# Patient Record
Sex: Female | Born: 1935 | Race: White | Hispanic: No | Smoking: Former smoker
Health system: Southern US, Community
[De-identification: ages and names within clinical notes are randomized; demographics above are authoritative.]

## PROBLEM LIST (undated history)

## (undated) DIAGNOSIS — N189 Chronic kidney disease, unspecified: Secondary | ICD-10-CM

## (undated) DIAGNOSIS — I10 Essential (primary) hypertension: Secondary | ICD-10-CM

## (undated) HISTORY — PX: OTHER SURGICAL HISTORY: SHX169

---

## 1998-04-08 ENCOUNTER — Other Ambulatory Visit: Admission: RE | Admit: 1998-04-08 | Discharge: 1998-04-08 | Payer: Self-pay | Admitting: Family Medicine

## 1998-05-27 ENCOUNTER — Ambulatory Visit (HOSPITAL_COMMUNITY): Admission: RE | Admit: 1998-05-27 | Discharge: 1998-05-27 | Payer: Self-pay | Admitting: Gastroenterology

## 1999-10-03 ENCOUNTER — Encounter: Admission: RE | Admit: 1999-10-03 | Discharge: 1999-10-03 | Payer: Self-pay | Admitting: Family Medicine

## 1999-10-03 ENCOUNTER — Encounter: Payer: Self-pay | Admitting: Family Medicine

## 2000-11-11 ENCOUNTER — Emergency Department (HOSPITAL_COMMUNITY): Admission: EM | Admit: 2000-11-11 | Discharge: 2000-11-11 | Payer: Self-pay | Admitting: Emergency Medicine

## 2000-11-11 ENCOUNTER — Encounter: Payer: Self-pay | Admitting: Emergency Medicine

## 2001-04-05 ENCOUNTER — Ambulatory Visit (HOSPITAL_COMMUNITY): Admission: RE | Admit: 2001-04-05 | Discharge: 2001-04-05 | Payer: Self-pay | Admitting: Gastroenterology

## 2001-09-17 ENCOUNTER — Inpatient Hospital Stay (HOSPITAL_COMMUNITY): Admission: EM | Admit: 2001-09-17 | Discharge: 2001-09-20 | Payer: Self-pay

## 2005-03-13 ENCOUNTER — Encounter: Admission: RE | Admit: 2005-03-13 | Discharge: 2005-03-13 | Payer: Self-pay | Admitting: Nephrology

## 2006-02-26 ENCOUNTER — Ambulatory Visit: Payer: Self-pay | Admitting: Oncology

## 2006-03-05 LAB — CBC WITH DIFFERENTIAL/PLATELET
Basophils Absolute: 0 10*3/uL (ref 0.0–0.1)
EOS%: 3.4 % (ref 0.0–7.0)
Eosinophils Absolute: 0.2 10*3/uL (ref 0.0–0.5)
HGB: 12.8 g/dL (ref 11.6–15.9)
LYMPH%: 36.8 % (ref 14.0–48.0)
MCH: 34.1 pg — ABNORMAL HIGH (ref 26.0–34.0)
MCV: 99.4 fL (ref 81.0–101.0)
MONO%: 11.3 % (ref 0.0–13.0)
NEUT#: 2.3 10*3/uL (ref 1.5–6.5)
NEUT%: 47.6 % (ref 39.6–76.8)
Platelets: 179 10*3/uL (ref 145–400)
RDW: 13.2 % (ref 11.3–14.5)

## 2006-03-08 LAB — URIC ACID: Uric Acid, Serum: 9.6 mg/dL — ABNORMAL HIGH (ref 2.4–7.0)

## 2006-03-08 LAB — IMMUNOFIXATION ELECTROPHORESIS
IgA: 169 mg/dL (ref 68–378)
IgG (Immunoglobin G), Serum: 1020 mg/dL (ref 694–1618)
IgM, Serum: 200 mg/dL (ref 60–263)
Total Protein, Serum Electrophoresis: 6.4 g/dL (ref 6.0–8.3)

## 2006-03-08 LAB — COMPREHENSIVE METABOLIC PANEL
AST: 16 U/L (ref 0–37)
Alkaline Phosphatase: 86 U/L (ref 39–117)
BUN: 38 mg/dL — ABNORMAL HIGH (ref 6–23)
Creatinine, Ser: 3.2 mg/dL — ABNORMAL HIGH (ref 0.4–1.2)
Glucose, Bld: 87 mg/dL (ref 70–99)
Potassium: 4.7 mEq/L (ref 3.5–5.3)
Total Bilirubin: 1 mg/dL (ref 0.3–1.2)

## 2006-03-08 LAB — KAPPA/LAMBDA LIGHT CHAINS: Kappa:Lambda Ratio: 1.39 (ref 0.26–1.65)

## 2006-03-21 LAB — UIFE/LIGHT CHAINS/TP QN, 24-HR UR
Free Kappa Lt Chains,Ur: 4.91 mg/dL — ABNORMAL HIGH (ref 0.04–1.51)
Free Kappa/Lambda Ratio: 12.59 ratio — ABNORMAL HIGH (ref 0.46–4.00)
Free Lambda Excretion/Day: 6.14 mg/d
Free Lt Chn Excr Rate: 77.33 mg/d
Time: 24 hours
Total Protein, Urine-Ur/day: 98 mg/d (ref 10–140)
Total Protein, Urine: 6.2 mg/dL

## 2006-03-21 LAB — CREATININE CLEARANCE, URINE, 24 HOUR
Creatinine, 24H Ur: 1216 mg/d (ref 700–1800)
Creatinine: 3 mg/dL — ABNORMAL HIGH (ref 0.4–1.2)
Urine Total Volume-CRCL: 1575 mL

## 2006-04-02 ENCOUNTER — Ambulatory Visit: Admission: RE | Admit: 2006-04-02 | Discharge: 2006-04-02 | Payer: Self-pay | Admitting: Vascular Surgery

## 2006-07-13 ENCOUNTER — Ambulatory Visit: Payer: Self-pay | Admitting: Oncology

## 2006-07-19 LAB — UIFE/LIGHT CHAINS/TP QN, 24-HR UR
Albumin, U: DETECTED
Alpha 1, Urine: DETECTED — AB
Alpha 2, Urine: DETECTED — AB
Beta, Urine: DETECTED — AB
Free Kappa Lt Chains,Ur: 5.89 mg/dL — ABNORMAL HIGH (ref 0.04–1.51)
Free Lambda Excretion/Day: 7.59 mg/d
Free Lt Chn Excr Rate: 101.6 mg/d
Gamma Globulin, Urine: DETECTED — AB
Total Protein, Urine-Ur/day: 121 mg/d (ref 10–140)
Total Protein, Urine: 7 mg/dL
Volume, Urine: 1725 mL

## 2006-07-21 LAB — IMMUNOFIXATION ELECTROPHORESIS: IgA: 149 mg/dL (ref 68–378)

## 2006-07-21 LAB — KAPPA/LAMBDA LIGHT CHAINS
Kappa free light chain: 4.83 mg/dL — ABNORMAL HIGH (ref 0.33–1.94)
Lambda Free Lght Chn: 3.15 mg/dL — ABNORMAL HIGH (ref 0.57–2.63)

## 2007-04-26 ENCOUNTER — Ambulatory Visit: Payer: Self-pay | Admitting: Vascular Surgery

## 2010-11-20 ENCOUNTER — Encounter: Payer: Self-pay | Admitting: Nephrology

## 2011-03-14 NOTE — Assessment & Plan Note (Signed)
OFFICE VISIT   Mcintyre, Lisa N  DOB:  Mar 23, 1927                                       04/26/2007  QMVHQ#:[id number]   The patient is sent for followup from Dr. Kathrene Bongo.  She currently  is not on hemodialysis.  She had a left brachiocephalic AV fistula  placed in June of 2007.   EXAMINATION:  Today blood pressure is 140/78, heart rate is 80 and  regular.  She has an easily palpable thrill in the left antecubital  region.  The fistula is approximately 6-mm in diameter over its first  four centimeters.  However, it gets smaller in the upper arm.  She has  some occasional mild symptoms of steal in her left hand which she states  usually resolved with moving her hand.  This is manifested as some  numbness and coolness occasionally.  She has no ischemic ulcerations on  the hand.   Since she is currently not on dialysis I would defer placement of any  new access at this time.  However, if she needed dialysis at this time,  the fistula is probably not going to be usable because the proximal  segment is not a long enough segment to stick for dialysis.  The upper  arm segment is rather small.  I discussed this with the patient today.  If she requires dialysis eminently, at some point in the future we need  to convert this to an AV graft.  However we will defer this for now  until she will need definitive dialysis treatment.  She will follow up  on an as-needed basis.   Janetta Hora. Fields, MD  Electronically Signed   CEF/MEDQ  D:  04/26/2007  T:  04/29/2007  Job:  [id number]

## 2011-03-17 NOTE — H&P (Signed)
[place]  Patient:    Lisa Mcintyre, Lisa Mcintyre Visit Number: [id number] MRN: [id number]          Service Type: MED Location: RCRM 2550 12 Attending Physician:  Wende Mott Dictated by:   Kennieth Rad, M.D. Admit Date:  09/17/2001                           History and Physical  CHIEF COMPLAINT:  Painful deformed left ankle.  HISTORY OF PRESENT ILLNESS:  This is a 77 year old female who was out walking her dog and slipped and missed her footing, landing onto her buttocks and turning her left ankle.  Patient denies any loss of consciousness. Complaining of severe pain, swelling, and some bleeding from the left ankle.  PAST MEDICAL HISTORY:  High blood pressure, hypercholesterolemia.  PAST SURGICAL HISTORY:  Right carpal tunnel release.  SOCIAL HISTORY:  Habits:  None.  MEDICATIONS:  Atenolol, Lipitor, aspirin.  PHYSICAL EXAMINATION  VITAL SIGNS:  Temperature 98.6, pulse 60, respirations 18, blood pressure 180/94.  GENERAL:  Alert and oriented.  No acute distress.  HEENT:  Normocephalic.  Eyes:  Sclerae and conjunctivae are clear.  NECK:  Supple.  CHEST:  Clear.  CARDIAC:  S1, S2 regular.  EXTREMITIES:  Left ankle:  Open wound, swollen.  Dorsalis pedis is intact. Nail beds pink and blanched.  LABORATORIES:  X-ray revealed bimalleolar fracture of the left ankle.  IMPRESSION:  Open bimalleolar fracture of the left ankle. Dictated by:   Kennieth Rad, M.D. Attending Physician:  Wende Mott DD:  09/17/01 TD:  09/17/01 Job: [id number]

## 2011-03-17 NOTE — Op Note (Signed)
NAMEGENEE, RANN                 ACCOUNT NO.:  [id number]   MEDICAL RECORD NO.:  [id number]          PATIENT TYPE:  AMB   LOCATION:  DFTL                         FACILITY:  [facility]   PHYSICIAN:  Janetta Hora. Fields, MD  DATE OF BIRTH:  17-Jan-1927   DATE OF PROCEDURE:  04/02/2006  DATE OF DISCHARGE:                                 OPERATIVE REPORT   PROCEDURE:  Left brachiocephalic AV fistula.   PREOPERATIVE DIAGNOSIS:  Renal failure.   POSTOPERATIVE DIAGNOSIS:  Renal failure.   ANESTHESIA:  Local with IV sedation.   ASSISTANT:  Nurse.   FINDINGS:  3 mm cephalic vein.   OPERATIVE DETAIL:  After obtaining informed consent, the patient was taken  to the operating room.  The patient placed supine position operating table.  After adequate sedation, the patient's entire left upper extremity prepped  and draped usual sterile fashion.  Local anesthesia was infiltrated over the  left antecubital crease.  Transverse incision was made this location carried  down through subcutaneous tissues down level cephalic vein.  Cephalic vein  was dissected free circumferentially.  Small side branches ligated divided  with silk ties.  Brachial artery was then dissected free in the medial  portion of incision.  This controlled proximal and distal with vessel loops.  The patient was then given 5000 units of intravenous heparin.  The cephalic  vein was ligated distally with 3-0 silk ties.  Vein was then transected and  then flushed thoroughly with heparinized saline.  Vein was marked for  orientation.  Longitudinal arteriotomy was then made in the brachial artery  and the vein swung over the level brachial artery and sewn end of vein to  side of artery using a running 7-0 Prolene suture.  Just prior to completion  anastomosis this was fore bled, back bled and thoroughly flushed.  Anastomosis was secured, clamps released.  There was palpable thrill in the  fistula immediately.  Hemostasis was obtained.   Subcutaneous tissues  reapproximated using running 3-0 Vicryl suture.  Skin was closed with 4-0  Vicryl subcuticular stitch.  The patient tolerated procedure well and no  complications.  Needle, sponge, and instrument counts correct at the end of  the case.  The patient taken to recovery room in stable condition.      Janetta Hora. Fields, MD  Electronically Signed     CEF/MEDQ  D:  04/02/2006  T:  04/02/2006  Job:  [id number]

## 2011-03-17 NOTE — Op Note (Signed)
[place]  Patient:    Lisa Mcintyre, Lisa Mcintyre Visit Number: [id number] MRN: [id number]          Service Type: MED Location: 5000 5024 01 Attending Physician:  Wende Mott Dictated by:   Kennieth Rad, M.D. Proc. Date: 09/17/01 Admit Date:  09/17/2001 Discharge Date: 09/20/2001                             Operative Report  PREOPERATIVE DIAGNOSIS:  Open bimalleolar fracture, left ankle.  POSTOPERATIVE DIAGNOSIS:  Open bimalleolar fracture, left ankle.  PROCEDURES: 1. Irrigation and debridement, left ankle. 2. Open reduction and internal fixation, bimalleolar fracture, with plate    with five screws laterally and medial malleolus screw medially. 3. Application of a short-leg cast.  ANESTHESIA:  General.  DESCRIPTION OF PROCEDURE:  The patient was taken to the operating room after given adequate preop medication and given general anesthesia and intubated. The left lower leg was prepped with Betadine and scrubbed and painted with Betadine solution and draped in a sterile manner.  C-arm was used to visualize the fracture reduction.  The open wound over the medial malleolus transversely was extended both proximally and distally.  Copious irrigation with Simpulse irrigation and antibiotic solution.  Excision and wound debridement about the skin edges was done.  The fracture site was opened and copiously irrigated and curetted to remove soft tissues.  After adequate wound debridement and irrigation, attention was then turned to the lateral malleolus.  Lateral incision made over the lateral malleolus and fibula, going through the skin and subcutaneous tissue down to the fracture site.  A compression plate was applied with five screws, holding it in anatomic and stable position.  Next attention was turned back to the medial malleolus.  The medial malleolus fragment was reduced, stabilized with a K-wire, followed by placement of cannulated  screw fixation across the fracture site, holding it in stable and anatomic position.  Further copious irrigation was done with antibiotic solution.  Wound closed with 2-0 Vicryl for the subcutaneous, skin staples for the skin.  Compressive dressings applied.  A short-leg cast was applied.  The patient tolerated the procedure quite well and went to the recovery room in stable and satisfactory condition. Dictated by:   Kennieth Rad, M.D. Attending Physician:  Wende Mott DD:  10/08/01 TD:  10/09/01 Job: [id number]

## 2011-03-17 NOTE — Discharge Summary (Signed)
[place]. [facility]  Patient:    Lisa Mcintyre, Lisa Mcintyre Visit Number: [id number] MRN: [id number]          Service Type: MED Location: 5000 5024 01 Attending Physician:  Wende Mott Dictated by:   Kennieth Rad, M.D. Admit Date:  09/17/2001 Discharge Date: 09/20/2001                             Discharge Summary  ADMITTING DIAGNOSIS:  Open bimalleolar fracture, left ankle.  DISCHARGE DIAGNOSIS:  Open bimalleolar fracture, left ankle.  COMPLICATIONS:  None.  INFECTIONS:  None.  OPERATIONS:  Irrigation and debridement, open reduction and internal fixation of left bimalleolar fracture, and application of short-leg cast.  HISTORY OF PRESENT ILLNESS:  This is a 77 year old female who was out walking her dog and slipped and fell, turning her left ankle.  The patient had to drag herself back to her home.  The patient denies any loss of consciousness.  PHYSICAL EXAMINATION:  Pertinent physical is that of the left ankle with open wound, swollen.  Dorsalis pedis is intact.  Nail beds pink and blanch. Obvious gross deformity of the left ankle.  HOSPITAL COURSE:  The patient underwent preop laboratories and was found to be stable enough for the patient to undergo surgery.  The patient underwent irrigation and debridement, ORIF of the left ankle, application of a short leg cast.  Postop course:  The patient received preoperative and postoperative IV antibiotics, ice packs, elevation, splitting of the cast, physical therapy, nonweightbearing on the left side.  The patient tolerated therapy quite well and became stable enough to be discharged to return to the office in 10 days on Percocet one p.o. q.4h. p.r.n. for pain, Vioxx 12.5 mg daily, nonweightbearing on the left side, use of walker, and patient discharged in stable and satisfactory condition. Dictated by:   Kennieth Rad, M.D. Attending Physician:  Wende Mott DD:  10/08/01 TD:   10/09/01 Job: [id number]

## 2011-12-13 ENCOUNTER — Other Ambulatory Visit: Payer: Self-pay | Admitting: Family Medicine

## 2011-12-13 ENCOUNTER — Ambulatory Visit
Admission: RE | Admit: 2011-12-13 | Discharge: 2011-12-13 | Disposition: A | Discharge status: Home / Self Care | Payer: Medicare Other | Source: Ambulatory Visit | Attending: Family Medicine | Admitting: Family Medicine

## 2011-12-13 DIAGNOSIS — W19XXXA Unspecified fall, initial encounter: Secondary | ICD-10-CM

## 2013-08-30 ENCOUNTER — Inpatient Hospital Stay (HOSPITAL_COMMUNITY)
Admission: EM | Admit: 2013-08-30 | Discharge: 2013-08-31 | DRG: 689 | Disposition: A | Discharge status: Home Health | Payer: Medicare Other | Attending: Internal Medicine | Admitting: Internal Medicine

## 2013-08-30 ENCOUNTER — Emergency Department (HOSPITAL_COMMUNITY): Payer: Medicare Other

## 2013-08-30 ENCOUNTER — Encounter (HOSPITAL_COMMUNITY): Payer: Self-pay | Admitting: Emergency Medicine

## 2013-08-30 DIAGNOSIS — N390 Urinary tract infection, site not specified: Principal | ICD-10-CM | POA: Diagnosis present

## 2013-08-30 DIAGNOSIS — B86 Scabies: Secondary | ICD-10-CM | POA: Diagnosis present

## 2013-08-30 DIAGNOSIS — E7800 Pure hypercholesterolemia, unspecified: Secondary | ICD-10-CM | POA: Diagnosis present

## 2013-08-30 DIAGNOSIS — I120 Hypertensive chronic kidney disease with stage 5 chronic kidney disease or end stage renal disease: Secondary | ICD-10-CM | POA: Diagnosis present

## 2013-08-30 DIAGNOSIS — G9340 Encephalopathy, unspecified: Secondary | ICD-10-CM | POA: Diagnosis present

## 2013-08-30 DIAGNOSIS — N184 Chronic kidney disease, stage 4 (severe): Secondary | ICD-10-CM | POA: Diagnosis present

## 2013-08-30 DIAGNOSIS — F0390 Unspecified dementia without behavioral disturbance: Secondary | ICD-10-CM | POA: Diagnosis present

## 2013-08-30 DIAGNOSIS — E875 Hyperkalemia: Secondary | ICD-10-CM | POA: Diagnosis present

## 2013-08-30 DIAGNOSIS — W19XXXA Unspecified fall, initial encounter: Secondary | ICD-10-CM | POA: Diagnosis present

## 2013-08-30 DIAGNOSIS — I10 Essential (primary) hypertension: Secondary | ICD-10-CM | POA: Diagnosis present

## 2013-08-30 DIAGNOSIS — R531 Weakness: Secondary | ICD-10-CM

## 2013-08-30 DIAGNOSIS — R410 Disorientation, unspecified: Secondary | ICD-10-CM

## 2013-08-30 DIAGNOSIS — N186 End stage renal disease: Secondary | ICD-10-CM | POA: Diagnosis present

## 2013-08-30 DIAGNOSIS — E785 Hyperlipidemia, unspecified: Secondary | ICD-10-CM | POA: Diagnosis present

## 2013-08-30 DIAGNOSIS — G4700 Insomnia, unspecified: Secondary | ICD-10-CM | POA: Diagnosis present

## 2013-08-30 HISTORY — DX: Essential (primary) hypertension: I10

## 2013-08-30 LAB — URINALYSIS, ROUTINE W REFLEX MICROSCOPIC
Bilirubin Urine: NEGATIVE
Glucose, UA: NEGATIVE mg/dL
Ketones, ur: NEGATIVE mg/dL
Protein, ur: NEGATIVE mg/dL

## 2013-08-30 LAB — CBC WITH DIFFERENTIAL/PLATELET
Basophils Relative: 0 % (ref 0–1)
Eosinophils Relative: 7 % — ABNORMAL HIGH (ref 0–5)
Hemoglobin: 10.6 g/dL — ABNORMAL LOW (ref 12.0–15.0)
Lymphocytes Relative: 34 % (ref 12–46)
Lymphs Abs: 1.8 10*3/uL (ref 0.7–4.0)
MCH: 35 pg — ABNORMAL HIGH (ref 26.0–34.0)
MCV: 107.3 fL — ABNORMAL HIGH (ref 78.0–100.0)
Monocytes Relative: 13 % — ABNORMAL HIGH (ref 3–12)
Neutrophils Relative %: 45 % (ref 43–77)
Platelets: 199 10*3/uL (ref 150–400)
RBC: 3.03 MIL/uL — ABNORMAL LOW (ref 3.87–5.11)
WBC: 5.2 10*3/uL (ref 4.0–10.5)

## 2013-08-30 LAB — COMPREHENSIVE METABOLIC PANEL
ALT: 8 U/L (ref 0–35)
AST: 21 U/L (ref 0–37)
Alkaline Phosphatase: 52 U/L (ref 39–117)
CO2: 18 mEq/L — ABNORMAL LOW (ref 19–32)
Creatinine, Ser: 3.23 mg/dL — ABNORMAL HIGH (ref 0.50–1.10)
GFR calc Af Amer: 14 mL/min — ABNORMAL LOW (ref >90)
GFR calc non Af Amer: 12 mL/min — ABNORMAL LOW (ref >90)
Glucose, Bld: 80 mg/dL (ref 70–99)
Potassium: 5.6 mEq/L — ABNORMAL HIGH (ref 3.5–5.1)
Sodium: 138 mEq/L (ref 135–145)

## 2013-08-30 LAB — ETHANOL: Alcohol, Ethyl (B): <11 mg/dL (ref 0–11)

## 2013-08-30 LAB — URINE MICROSCOPIC-ADD ON

## 2013-08-30 MED ORDER — SODIUM POLYSTYRENE SULFONATE 15 GM/60ML PO SUSP
15.0000 g | Freq: Three times a day (TID) | ORAL | Status: DC
  Administered 2013-08-30 – 2013-08-31 (×3): 15 g via ORAL
  Filled 2013-08-30 (×4): qty 60

## 2013-08-30 MED ORDER — METOPROLOL TARTRATE 100 MG PO TABS
100.0000 mg | ORAL_TABLET | Freq: Two times a day (BID) | ORAL | Status: DC
  Administered 2013-08-30 – 2013-08-31 (×2): 100 mg via ORAL
  Filled 2013-08-30 (×3): qty 1

## 2013-08-30 MED ORDER — MORPHINE SULFATE 4 MG/ML IJ SOLN: 4.0000 mg | INTRAMUSCULAR | Status: DC | PRN

## 2013-08-30 MED ORDER — PERMETHRIN 5 % EX CREA
TOPICAL_CREAM | Freq: Once | CUTANEOUS | Status: AC
  Administered 2013-08-30: 23:00:00 via TOPICAL
  Filled 2013-08-30: qty 60

## 2013-08-30 MED ORDER — AMLODIPINE BESYLATE 10 MG PO TABS
10.0000 mg | ORAL_TABLET | Freq: Every day | ORAL | Status: DC
  Administered 2013-08-30 – 2013-08-31 (×2): 10 mg via ORAL
  Filled 2013-08-30 (×2): qty 1

## 2013-08-30 MED ORDER — ENOXAPARIN SODIUM 30 MG/0.3ML ~~LOC~~ SOLN
30.0000 mg | SUBCUTANEOUS | Status: DC
  Administered 2013-08-30: 30 mg via SUBCUTANEOUS
  Filled 2013-08-30 (×2): qty 0.3

## 2013-08-30 MED ORDER — SIMVASTATIN 20 MG PO TABS
20.0000 mg | ORAL_TABLET | Freq: Every day | ORAL | Status: DC
  Filled 2013-08-30: qty 1

## 2013-08-30 MED ORDER — DEXTROSE 5 % IV SOLN
1.0000 g | Freq: Once | INTRAVENOUS | Status: AC
  Administered 2013-08-30: 1 g via INTRAVENOUS
  Filled 2013-08-30: qty 10

## 2013-08-30 MED ORDER — DEXTROSE 5 % IV SOLN
1.0000 g | Freq: Once | INTRAVENOUS | Status: DC
  Filled 2013-08-30: qty 10

## 2013-08-30 NOTE — Progress Notes (Signed)
08/30/13 1844 nsg Report called 1834 patient arrived on the floor 1844. To unit 6700 per stretcher accompanied by NT alert patient with daughter at bedside. Vital signs taken ; placed patient on contact precaution r/o scabies. Placed patient on npo advised daughter as well. MD notified of patients arrival on the floor. Reported to incoming RN.

## 2013-08-30 NOTE — ED Notes (Signed)
Pt ambulated to restroom with one assist

## 2013-08-30 NOTE — ED Notes (Signed)
Portable xray at bedside.

## 2013-08-30 NOTE — H&P (Signed)
Date: 08/30/2013               Patient Name:  Lisa Mcintyre MRN: [id number]  DOB: Sep 03, 1927 Age / Sex: 79 y.o., female   PCP: No primary provider on file.         Medical Service: Internal Medicine Teaching Service         Attending Physician: Dr. Burns Spain, MD    First Contact: Dr. Otis Brace Pager: [phone]  Second Contact: Dr. Christen Bame Pager: [phone]       After Hours (After 5p/  First Contact Pager: [phone]  weekends / holidays): Second Contact Pager: [phone]   Chief Complaint: Altered mental status  History of Present Illness:  Lisa Mcintyre is a 79 y.o. female PMH HTN, CKD, HLD who presents today with altered mental status. The history was provided by the patient and her daughter Lisa Mcintyre. Unfortunately, both are poor historians.  The patient states that at 2am this morning she woke up and went to the bathroom. She lost her footing and fell between the commode and the sink, with no trauma, LOC, or head injury. Again, around 1:30pm, family states she fell again. She was sitting on the couch when it occurred the second time. Family thought she was confused and short of breath, so they called EMS. Per daughter, she is now at her baseline mental status. Patient denies new weakness, numbness, chest pain, shortness of breath, abdominal pain, pain in her extremities or joints. She notes that she sometimes gets short of breath when she walks around the neighborhood, which is a daily routine for her. She denies new or worsening leg swelling, orthopnea, or PND. She denies dysuria, but does reported increased urinary frequency for the past week. ("I have to go every 15 minutes.") She reports itching and rash on her abdomen for the past week as well. Her great grandson had scabies a few weeks back and passed it to her daughter, who was supposedly treated per family. Because of the itching, her sleep has been very poor over the past few days. She denies starting any new  medications.  She lives with a son and daughter who are present at all times of the day (the daughter who had scabies, but not the daughter who is present today). Per the present daughter, she is independent in all of her ADLs and even takes walks by herself, though her son has been starting to accompany her. She follows with a primary care doctor at [facility] every 3 months, but is unsure of the name. She reports a history of high blood pressure, high cholesterol, and kidney disease. ("Only 1/3rd of my kidneys work.") She has an AVF in her left arm, but insists she has never had dialysis and would never want it. When asked why, she says she saw a friend suffer too much on dialysis. In terms of surgical history, she also says her right foot "broke off at the ankle" years ago requiring orthopedic surgery and "100 stiches".   Meds: Current Facility-Administered Medications  Medication Dose Route Frequency Provider Last Rate Last Dose  . cefTRIAXone (ROCEPHIN) 1 g in dextrose 5 % 50 mL IVPB  1 g Intravenous Once Roney Marion, MD      . morphine 4 MG/ML injection 4 mg  4 mg Intravenous Q1H PRN Roney Marion, MD         Home Medication List  amLODipine 10 MG tablet  Commonly known as:  NORVASC  Take 10 mg by mouth daily.     CALCIUM PO  Take 1 tablet by mouth daily.     metoprolol 100 MG tablet  Commonly known as:  LOPRESSOR  Take 100 mg by mouth 2 (two) times daily.     pravastatin 40 MG tablet  Commonly known as:  PRAVACHOL  Take 40 mg by mouth daily.        Allergies: Allergies as of 08/30/2013  . (Not on File)   Past Medical History  Diagnosis Date  . Hypertension   . Renal disorder    History reviewed. No pertinent past surgical history. History reviewed. No pertinent family history. History   Social History  . Marital Status: Widowed    Spouse Name: N/A    Number of Children: N/A  . Years of Education: N/A   Occupational History  . Not on file.    Social History Main Topics  . Smoking status: Former Smoker    Types: Cigarettes  . Smokeless tobacco: Not on file  . Alcohol Use: No  . Drug Use: Not on file  . Sexual Activity: Not on file   Other Topics Concern  . Not on file   Social History Narrative  . No narrative on file    Review of Systems: Pertinent items are noted in HPI.  Physical Exam: Blood pressure 159/40, pulse 64, temperature 98.2 F (36.8 C), temperature source Oral, resp. rate 20, height 5\' 5"  (1.651 m), weight 141 lb 1.6 oz (64.003 kg), SpO2 97.00%. Physical Exam  Constitutional: She is oriented to person, place, and time and well-developed, well-nourished, and in no distress.  HENT:  Head: Normocephalic and atraumatic.  Mouth/Throat: Oropharynx is clear and moist.  Eyes: Conjunctivae and EOM are normal. Pupils are equal, round, and reactive to light.  Neck: Normal range of motion. Neck supple.  Cardiovascular: Normal rate, regular rhythm, normal heart sounds and intact distal pulses.  Exam reveals no gallop and no friction rub.   No murmur heard. LUA AVF with strong bruit and palpable thrill.  Pulmonary/Chest: Effort normal and breath sounds normal.  Abdominal: Soft. Bowel sounds are normal. She exhibits no distension. There is no tenderness.  Musculoskeletal: Normal range of motion. She exhibits no edema and no tenderness.  Neurological: She is alert and oriented to person, place, and time. No cranial nerve deficit. GCS score is 15.  Skin: Skin is warm and dry. Rash (Diffuse maculopapular rash on abdomen, back, and bilateral arms. It is most itchy in the skin fold of her groin. Legs and face are largely spared.) noted. She is not diaphoretic.  Superficial scrape on her right elbow, nonbleeding.  Psychiatric: Affect normal.  Patient is oriented to person, place, and year but did not know that yesterday was Halloween. She is perseverating somewhat on forgetting the name of her orthopedic surgeon, though  we tell her multiple times it's okay that she cannot remember.    Lab results: Basic Metabolic Panel:  Recent Labs  16/10/96 1618  NA 138  K 5.6*  CL 107  CO2 18*  GLUCOSE 80  BUN 35*  CREATININE 3.23*  CALCIUM 8.9   Liver Function Tests:  Recent Labs  08/30/13 1618  AST 21  ALT 8  ALKPHOS 52  BILITOT 0.7  PROT 7.3  ALBUMIN 3.5   CBC:  Recent Labs  08/30/13 1618  WBC 5.2  NEUTROABS 2.4  HGB 10.6*  HCT 32.5*  MCV 107.3*  PLT 199   Cardiac Enzymes:  Recent Labs  08/30/13 1618  TROPONINI <0.30    Recent Labs  08/30/13 1618  ETH <11   Urinalysis:  Recent Labs  08/30/13 1614  COLORURINE YELLOW  LABSPEC 1.009  PHURINE 5.5  GLUCOSEU NEGATIVE  HGBUR SMALL*  BILIRUBINUR NEGATIVE  KETONESUR NEGATIVE  PROTEINUR NEGATIVE  UROBILINOGEN 0.2  NITRITE NEGATIVE  LEUKOCYTESUR LARGE*    Imaging results:  Ct Head Wo Contrast  08/30/2013   CLINICAL DATA:  Fall. Hit head. Unresponsive. Headache. Vertigo and hypertension.  EXAM: CT HEAD WITHOUT CONTRAST  TECHNIQUE: Contiguous axial images were obtained from the base of the skull through the vertex without intravenous contrast.  COMPARISON:  None.  FINDINGS: There is no evidence of intracranial hemorrhage, brain edema, or other signs of acute infarction. There is no evidence of intracranial mass lesion or mass effect. No abnormal extraaxial fluid collections are identified.  Mild diffuse cerebral atrophy and chronic small vessel disease noted. No evidence of hydrocephalus. No fracture or other bone lesions involving the skull.  IMPRESSION: No acute intracranial findings.  Mild cerebral atrophy and chronic small vessel disease.   Electronically Signed   By: Myles Rosenthal M.D.   On: 08/30/2013 17:00   Dg Chest Port 1 View  08/30/2013   CLINICAL DATA:  Altered mental status. Hypertension.  EXAM: PORTABLE CHEST - 1 VIEW  COMPARISON:  04/02/2006  FINDINGS: Low lung volumes over demonstrated. Heart size is prominent,  likely due low lung volumes. Both lungs remain clear.  IMPRESSION: Low lung volumes. No acute findings.   Electronically Signed   By: Myles Rosenthal M.D.   On: 08/30/2013 17:07    Other results: EKG: There are no previous tracings available for comparison. Unusual tremor artifact. Some slowed R wave progression. No concerning ST/T wave changes. No QRS widening. Will repeat due to artifact.  Assessment & Plan by Problem: Lisa Mcintyre is a 79 y.o. female PMH HTN, CKD, HLD who presents today with altered mental status.  #Possible UTI - Patient reports a history of urinary frequency. UA is notable for large leuk esterase, rare bacteria. Urine culture is pending. UTI is a common cause of altered mental status in the elderly. No signs of dehydration on exam. - Admit to IMTS - Continue IV ceftriaxone - Encourage po fluid intake - PT eval and treat  #Presumed scabies - Patient has a typical, pruritic scabies rash and a strong exposure history. Family is not sure if all linens and clothing in the house were washed after her daughter was recently treated for scabies. Patient reports insomnia 2/2 itching, which could be contributing to her history of AMS and falls. - Permethrin cream applied head to toe, leave on for 12 hours before washing - Linen change tomorrow - Contact precautions  #Hyperkalemia - K is 5.6 on admission. No concerning EKG changes. Patient has CKD stage 5. We do not know her baseline potassium level. - Kayexalate 15g TID  - Repeat BMP in am - Repeat EKG due to artifact - Recommend calling [facility] in the am to get prior BMP results  #CKD, stage 5 - BUN/Cr 35/3.2, stable since 2007 (BUN/Cr 38/3.2 on 03/05/06). Patient has an AVF but does not desire dialysis and says she has never had it in the past. - Renal diet  #HTN - Stable, 140/90s currently. - Continue home amlodipine, metoprolol  #HLD - Continue home statin  #DVT PPX - Lovenox subq with renal dosing  #  Code status  - Limited. Yes to chest compression and defibrillation, no to intubation.  Dispo: Disposition is deferred at this time, awaiting improvement of current medical problems. Anticipated discharge in approximately 1-3 day(s).   The patient does have a current PCP (No primary provider on file.) and does need an [facility] hospital follow-up appointment after discharge.  The patient does not have transportation limitations that hinder transportation to clinic appointments.  Signed: Vivi Barrack, MD 08/30/2013, 7:40 PM

## 2013-08-30 NOTE — ED Notes (Signed)
MD at bedside. 

## 2013-08-30 NOTE — ED Provider Notes (Signed)
CSN: [id number]     Arrival date & time 08/30/13  1452 History   First MD Initiated Contact with Patient 08/30/13 1454     Chief Complaint  Patient presents with  . Altered Mental Status    HPI   Per the patient's old chart her primary care physician is Dr. Lacy Duverney. She is uncertain of her primary care is. Her family is uncertain. She presents today via EMS and accompanied by her son and granddaughter. Her history is quite difficult to outline and ascertain from the patient, her son, and granddaughter. Background information I was able to obtain is that she has a history of high blood pressure and renal disease. During my exam I do find a left upper arm AV graft. She states that she was "supposed to get dialysis but didn't have to". She states that she would rather die than have dialysis and states "them know I would never have it".  Apparently what brought her to the emergency room today is ill-defined episode this morning. Apparently she was getting from her bed to the bathroom and ended up on the floor. Her son helped her up. She is able to ambulate to the bathroom. She went to the couch. Son states she may have ended up on the floor one additional time. Normal every morning she gets "goes on down the road". By this apparently she walks every morning and she did not today. Son states that she seemed confused and was talking about bees in the house and beans in the house.  He is fairly clear after a marked amount of prompting and requestioning that this was for about an hour this morning that she seemed different but that now she seems normal to him.  Past Medical History  Diagnosis Date  . Hypertension   . Renal disorder    History reviewed. No pertinent past surgical history. History reviewed. No pertinent family history. History  Substance Use Topics  . Smoking status: Former Smoker    Types: Cigarettes  . Smokeless tobacco: Not on file  . Alcohol Use: No   OB History   Grav Para  Term Preterm Abortions TAB SAB Ect Mult Living                 Review of Systems  Constitutional: Negative for fever, chills, diaphoresis, appetite change and fatigue.  HENT: Negative for mouth sores, sore throat and trouble swallowing.        No headache.  Eyes: Negative for visual disturbance.  Respiratory: Negative for cough, chest tightness, shortness of breath and wheezing.   Cardiovascular: Negative for chest pain.  Gastrointestinal: Negative for nausea, vomiting, abdominal pain, diarrhea and abdominal distention.  Endocrine: Negative for polydipsia, polyphagia and polyuria.  Genitourinary: Negative for dysuria, frequency and hematuria.  Musculoskeletal: Negative for gait problem.  Skin: Negative for color change, pallor and rash.       Rash to her trunk is been there for "a long time".  Neurological: Negative for dizziness, syncope, light-headedness and headaches.  Hematological: Does not bruise/bleed easily.  Psychiatric/Behavioral: Negative for behavioral problems and confusion.    Allergies  Review of patient's allergies indicates not on file.  Home Medications   Current Outpatient Rx  Name  Route  Sig  Dispense  Refill  . amLODipine (NORVASC) 10 MG tablet   Oral   Take 10 mg by mouth daily.         Marland Kitchen CALCIUM PO   Oral   Take 1 tablet  by mouth daily.         . metoprolol (LOPRESSOR) 100 MG tablet   Oral   Take 100 mg by mouth 2 (two) times daily.         . pravastatin (PRAVACHOL) 40 MG tablet   Oral   Take 40 mg by mouth daily.          BP 152/100  Pulse 57  Temp(Src) 98.2 F (36.8 C) (Oral)  Resp 18  SpO2 100% Physical Exam  Constitutional: She is oriented to person, place, and time. She appears well-developed and well-nourished. No distress.  Laying on her right side. Awakens easily. Distress. Disoriented to time but oriented to place and situation and person  HENT:  Head: Normocephalic.  No signs of trauma the head  Eyes: Conjunctivae are  normal. Pupils are equal, round, and reactive to light. No scleral icterus.  Normal conjunctiva. No icterus.  Neck: Normal range of motion. Neck supple. No thyromegaly present.  Cardiovascular: Normal rate and regular rhythm.  Exam reveals no gallop and no friction rub.   No murmur heard. Pulmonary/Chest: Effort normal and breath sounds normal. No respiratory distress. She has no wheezes. She has no rales.  Clear lungs  Abdominal: Soft. Bowel sounds are normal. She exhibits no distension. There is no tenderness. There is no rebound.  Musculoskeletal: Normal range of motion.  Neurological: She is alert and oriented to person, place, and time.  Answers simple questions easily. Oriented to place and person disoriented to time by a few days.    Skin: Skin is warm and dry. No rash noted.  Diffuse macular rash on the trunk.  No petechiae  Psychiatric: She has a normal mood and affect. Her behavior is normal.    ED Course  Procedures (including critical care time) Labs Review Labs Reviewed  CBC WITH DIFFERENTIAL - Abnormal; Notable for the following:    RBC 3.03 (*)    Hemoglobin 10.6 (*)    HCT 32.5 (*)    MCV 107.3 (*)    MCH 35.0 (*)    Monocytes Relative 13 (*)    Eosinophils Relative 7 (*)    All other components within normal limits  COMPREHENSIVE METABOLIC PANEL - Abnormal; Notable for the following:    Potassium 5.6 (*)    CO2 18 (*)    BUN 35 (*)    Creatinine, Ser 3.23 (*)    GFR calc non Af Amer 12 (*)    GFR calc Af Amer 14 (*)    All other components within normal limits  URINALYSIS, ROUTINE W REFLEX MICROSCOPIC - Abnormal; Notable for the following:    APPearance CLOUDY (*)    Hgb urine dipstick SMALL (*)    Leukocytes, UA LARGE (*)    All other components within normal limits  URINE MICROSCOPIC-ADD ON - Abnormal; Notable for the following:    Squamous Epithelial / LPF FEW (*)    All other components within normal limits  URINE CULTURE  ETHANOL  TROPONIN I   ABO/RH   Imaging Review Ct Head Wo Contrast  08/30/2013   CLINICAL DATA:  Fall. Hit head. Unresponsive. Headache. Vertigo and hypertension.  EXAM: CT HEAD WITHOUT CONTRAST  TECHNIQUE: Contiguous axial images were obtained from the base of the skull through the vertex without intravenous contrast.  COMPARISON:  None.  FINDINGS: There is no evidence of intracranial hemorrhage, brain edema, or other signs of acute infarction. There is no evidence of intracranial mass lesion or mass effect.  No abnormal extraaxial fluid collections are identified.  Mild diffuse cerebral atrophy and chronic small vessel disease noted. No evidence of hydrocephalus. No fracture or other bone lesions involving the skull.  IMPRESSION: No acute intracranial findings.  Mild cerebral atrophy and chronic small vessel disease.   Electronically Signed   By: Myles Rosenthal M.D.   On: 08/30/2013 17:00   Dg Chest Port 1 View  08/30/2013   CLINICAL DATA:  Altered mental status. Hypertension.  EXAM: PORTABLE CHEST - 1 VIEW  COMPARISON:  04/02/2006  FINDINGS: Low lung volumes over demonstrated. Heart size is prominent, likely due low lung volumes. Both lungs remain clear.  IMPRESSION: Low lung volumes. No acute findings.   Electronically Signed   By: Myles Rosenthal M.D.   On: 08/30/2013 17:07    EKG Interpretation     Ventricular Rate:  124 PR Interval:    QRS Duration: 75 QT Interval:  435 QTC Calculation: 625 R Axis:   -13 Text Interpretation:  Sinus Rhythm with tremmor artifact Ventricular premature complex Slow R wave progression. Prolonged QT interval            MDM   1. Urinary tract infection   2. Weakness   3. Delirium      Elderly female with weakness and episode of confusion. Still rather profoundly weak here requiring maximum assistance of the nurse to even get to the bedside to use the commode. Signs of UTI and urinalysis. Urine culture pending. No signs of acute CVA. TIA which of a consideration. Urinary  tract infection is evident as well. I placed a call to hospitalist regarding patient's admission.    Roney Marion, MD 09/08/13 [phone]

## 2013-08-30 NOTE — ED Notes (Signed)
Pt from home, Family called initially for unresponsive pt. Per EMS, fire found pt alert and confused. Pt alert and oriented to person and place only. NSD. Denies pain. Stroke exam neg per EMS.

## 2013-08-31 ENCOUNTER — Encounter (HOSPITAL_COMMUNITY): Payer: Self-pay | Admitting: *Deleted

## 2013-08-31 LAB — BASIC METABOLIC PANEL
BUN: 35 mg/dL — ABNORMAL HIGH (ref 6–23)
Calcium: 8.4 mg/dL (ref 8.4–10.5)
Chloride: 110 mEq/L (ref 96–112)
Creatinine, Ser: 3.25 mg/dL — ABNORMAL HIGH (ref 0.50–1.10)
GFR calc Af Amer: 14 mL/min — ABNORMAL LOW (ref >90)
GFR calc non Af Amer: 12 mL/min — ABNORMAL LOW (ref >90)

## 2013-08-31 LAB — URINE CULTURE: Colony Count: >=100000

## 2013-08-31 MED ORDER — CEPHALEXIN 500 MG PO CAPS: 500.0000 mg | ORAL_CAPSULE | Freq: Two times a day (BID) | ORAL | Status: DC

## 2013-08-31 MED ORDER — IVERMECTIN 3 MG PO TABS: 3.0000 mg | ORAL_TABLET | Freq: Once | ORAL | Status: DC

## 2013-08-31 MED ORDER — PERMETHRIN 5 % EX CREA: TOPICAL_CREAM | CUTANEOUS | Status: DC

## 2013-08-31 MED ORDER — POLYETHYLENE GLYCOL 3350 17 G PO PACK
17.0000 g | PACK | Freq: Every day | ORAL | Status: DC
  Administered 2013-08-31: 17 g via ORAL
  Filled 2013-08-31: qty 1

## 2013-08-31 NOTE — Discharge Summary (Signed)
Name: Lisa Mcintyre MRN: [id number] DOB: 1927/06/27 79 y.o. PCP: No primary provider on file.  Date of Admission: 08/30/2013  2:52 PM Date of Discharge: 08/31/2013 Attending Physician: Blanch Media, MD  Discharge Diagnosis: 1.  Principal Problem:   Acute encephalopathy Active Problems:   Scabies   Urinary tract infection   Chronic kidney disease (CKD), stage IV (severe)   Essential hypertension, benign   Hyperkalemia   Hyperlipidemia  Discharge Medications:   Medication List         amLODipine 10 MG tablet  Commonly known as:  NORVASC  Take 10 mg by mouth daily.     CALCIUM PO  Take 1 tablet by mouth daily.     cephALEXin 500 MG capsule  Commonly known as:  KEFLEX  Take 1 capsule (500 mg total) by mouth 2 (two) times daily.     ivermectin 3 MG Tabs tablet  Commonly known as:  STROMECTOL  Take 1 tablet (3 mg total) by mouth once.     metoprolol 100 MG tablet  Commonly known as:  LOPRESSOR  Take 100 mg by mouth 2 (two) times daily.     permethrin 5 % cream  Commonly known as:  ELIMITE  - Apply daily for 6 days then two times per week until cured   - Apply head to toe, leave on 8-14 hrs before washing off     pravastatin 40 MG tablet  Commonly known as:  PRAVACHOL  Take 40 mg by mouth daily.        Disposition and follow-up:   Ms.Ewelina N Guice was discharged from [facility] in Good condition.  At the hospital follow up visit please address:  1.  Resolution of UTI      Resolution of presumed scabies infection s/p ivermectin & permethrin cream       Etiology of macrocytic anemia  2.  Labs / imaging needed at time of follow-up: UA, folate, B12  3.  Pending labs/ test needing follow-up: none  Follow-up Appointments:     Follow-up Information   Schedule an appointment as soon as possible for a visit with Kaleen Mask, MD.   Specialty:  [facility]   Contact information:   [address] [place]  [address] [phone]       Follow up with [facility]. (home health nurse, aide, physical therapy)    Contact information:   [address] [place] [address] [phone]       Discharge Instructions: -Apply permethrin 5 % cream  daily for 6 days then two times per week until cured. Apply on body and leave on 8-14 hrs before washing off -Take ivermectin 5 tablets (15 mg)  then take 5 tablets (15mg ) in 10 days ---Take on empty stomach -Take Keflex 500 mg twice a day for 7 days  -Please make appointment with your PCP on Monday   Consultations:  none  Procedures Performed:  Ct Head Wo Contrast  08/30/2013   CLINICAL DATA:  Fall. Hit head. Unresponsive. Headache. Vertigo and hypertension.  EXAM: CT HEAD WITHOUT CONTRAST  TECHNIQUE: Contiguous axial images were obtained from the base of the skull through the vertex without intravenous contrast.  COMPARISON:  None.  FINDINGS: There is no evidence of intracranial hemorrhage, brain edema, or other signs of acute infarction. There is no evidence of intracranial mass lesion or mass effect. No abnormal extraaxial fluid collections are identified.  Mild diffuse cerebral atrophy and chronic small  vessel disease noted. No evidence of hydrocephalus. No fracture or other bone lesions involving the skull.  IMPRESSION: No acute intracranial findings.  Mild cerebral atrophy and chronic small vessel disease.   Electronically Signed   By: Myles Rosenthal M.D.   On: 08/30/2013 17:00   Dg Chest Port 1 View  08/30/2013   CLINICAL DATA:  Altered mental status. Hypertension.  EXAM: PORTABLE CHEST - 1 VIEW  COMPARISON:  04/02/2006  FINDINGS: Low lung volumes over demonstrated. Heart size is prominent, likely due low lung volumes. Both lungs remain clear.  IMPRESSION: Low lung volumes. No acute findings.   Electronically Signed   By: Myles Rosenthal M.D.   On: 08/30/2013 17:07    2D Echo: none  Cardiac Cath: none  Admission HPI:   Lisa Mcintyre is a 79  y.o. female PMH HTN, CKD, HLD who presents today with altered mental status. The history was provided by the patient and her daughter Talbert Forest. Unfortunately, both are poor historians.  The patient states that at 2am this morning she woke up and went to the bathroom. She lost her footing and fell between the commode and the sink, with no trauma, LOC, or head injury. Again, around 1:30pm, family states she fell again. She was sitting on the couch when it occurred the second time. Family thought she was confused and short of breath, so they called EMS. Per daughter, she is now at her baseline mental status. Patient denies new weakness, numbness, chest pain, shortness of breath, abdominal pain, pain in her extremities or joints. She notes that she sometimes gets short of breath when she walks around the neighborhood, which is a daily routine for her. She denies new or worsening leg swelling, orthopnea, or PND. She denies dysuria, but does reported increased urinary frequency for the past week. ("I have to go every 15 minutes.") She reports itching and rash on her abdomen for the past week as well. Her great grandson had scabies a few weeks back and passed it to her daughter, who was supposedly treated per family. Because of the itching, her sleep has been very poor over the past few days. She denies starting any new medications.   She lives with a son and daughter who are present at all times of the day (the daughter who had scabies, but not the daughter who is present today). Per the present daughter, she is independent in all of her ADLs and even takes walks by herself, though her son has been starting to accompany her. She follows with a primary care doctor at [facility] every 3 months, but is unsure of the name. She reports a history of high blood pressure, high cholesterol, and kidney disease. ("Only 1/3rd of my kidneys work.") She has an AVF in her left arm, but insists she has never had dialysis and would  never want it. When asked why, she says she saw a friend suffer too much on dialysis. In terms of surgical history, she also says her right foot "broke off at the ankle" years ago requiring orthopedic surgery and "100 stiches".   Hospital Course by problem list: Principal Problem:   Acute encephalopathy Active Problems:   Scabies   Urinary tract infection   Chronic kidney disease (CKD), stage IV (severe)   Essential hypertension, benign   Hyperkalemia   Hyperlipidemia   Acute Encephalopathy - Patient with confusion following two falls (w/o LOC) without hitting her head at home with change from normal mental status that  resolved on admission per family. CT head revealed no acute intracranial findings. No neurological symptoms or findings on physical exam to suggest neurological etiology. Chest x-ray without cardiopulmonary disease.  Ethanol levels were negative. 12-lead EKG and troponin levels did not indicate cardiac  Ischemia. Etiology of AMS most likely multifactorial. Patient with symptomatic UTI and insomnia due to pruritis from  presumed scabies infection. Also most likely with baseline dementia. Physical therapy was consulted due to recent falls and suggest SNF placement however patient did not want to pursue this option and was adamant about returning home. Per family there is always some one at home taking care of her and monitoring her. Home health services including RN, aide, PT, and SW consult to see patient at home. At time of discharge patient was not mentally altered and at normal baseline per family members. She was able to ambulate by herself without difficulty.    Urinary Tract Infection - Patient with increased urinary frequency for approximately one week. Urine analysis with evidence of infection LE (+) and urine culture with >100K multiple bacterial morphotypes. Patient received IV ceftriaxone during hospitalization and prescribed keflex 500 mg BID for 7 days. Per son cost not an  issue and  medication will be obtained. She reported improvement in urinary frequency at time of discharge.      Scabies - Patient with severe pruritic rash on abdomen, back, and bilateral arms for approximately one week with family member at home with scabies. Rash presumed to be scabies. Ideally examination of skin scraping under light microscope needed to make diagnosis. Patient with some improvement with permethrin 5% cream, however still with pruritis. Patient was prescribed oral ivermectin (15mg  x1 then 15 mg x 1 10 days later) and 5% permethrin cream daily for 6 days then x2 times per week until cure.    CKD Stage IV/V - Patient with Cr on admission of 3.23 at baseline of 3.2 (7 years ago) with GFR 14. At time of discharge Cr was 3.25. Patient has mature AVF but refuses dialysis. Patient was continued on renal diet during hospitalization.   Hyperkalemia - Patient with potassium of 5.6 (hemolyzed) on admission with improved to 5.2 at time of discharge that is most likely due to severe CKD. Baseline potassium was unknown. There were no EKG changes or cardiac arrythmia's during hospitalization. Patient received kayexalate during hospitalization.   Hypertension -Patient with blood pressure range between 120/60 - 180/132 without hypertensive emergency (signs of end organ damage). Patient was continued on home metoprolol 100 mg BID and 10 mg amlodipine during hospitalization.    Macrocytic Anemia - Patient with elevated MCV and anemia. Patient with negative alcohol levels. Etiology unknown,  possibly B12 or folate deficiency. Will need outpatient work-up.    Hyperlipidemia - Patient on pravastatin 40 mg daily. No past lipid panel on file.    Constipation - Patient received miralax during hospitalization.   DVT prophylaxis - Patient received lovenox with renal dosing. Patient without  leg pain or swelling during hospitalization.      Discharge Vitals:   BP 120/60  Pulse 72  Temp(Src) 98.7 F  (37.1 C) (Oral)  Resp 18  Ht 5\' 5"  (1.651 m)  Wt 64.003 kg (141 lb 1.6 oz)  BMI 23.48 kg/m2  SpO2 98%  Discharge Labs:  Results for orders placed during the hospital encounter of 08/30/13 (from the past 24 hour(s))  BASIC METABOLIC PANEL     Status: Abnormal   Collection Time    08/31/13  5:30 AM      Result Value Range   Sodium 140  135 - 145 mEq/L   Potassium 5.2 (*) 3.5 - 5.1 mEq/L   Chloride 110  96 - 112 mEq/L   CO2 22  19 - 32 mEq/L   Glucose, Bld 96  70 - 99 mg/dL   BUN 35 (*) 6 - 23 mg/dL   Creatinine, Ser 1.61 (*) 0.50 - 1.10 mg/dL   Calcium 8.4  8.4 - 09.6 mg/dL   GFR calc non Af Amer 12 (*) >90 mL/min   GFR calc Af Amer 14 (*) >90 mL/min    Signed: Otis Brace, MD 08/31/2013, 10:13 PM   Time Spent on Discharge: 60 minutes Services Ordered on Discharge: home health RN, PT, Aide, SW Equipment Ordered on Discharge:  Pt already with rolling walker at home

## 2013-08-31 NOTE — Progress Notes (Signed)
   CARE MANAGEMENT NOTE 08/31/2013  Patient:  Lisa Mcintyre, Lisa Mcintyre   Account Number:  [id number]  Date Initiated:  08/31/2013  Documentation initiated by:  [facility]  Subjective/Objective Assessment:   adm:     Action/Plan:   Anticipated DC Date:     Anticipated DC Plan:  HOME W HOME HEALTH SERVICES      DC Planning Services  CM consult      [facility] Choice  HOME HEALTH   Choice offered to / List presented to:  C-4 Adult Children        HH arranged  HH-1 RN  HH-2 PT  HH-4 NURSE'S AIDE  HH-6 SOCIAL WORKER      HH agency  [facility]   Status of service:  Completed, signed off Medicare Important Message given?   (If response is "NO", the following Medicare IM given date fields will be blank) Date Medicare IM given:   Date Additional Medicare IM given:    Discharge Disposition:  HOME W HOME HEALTH SERVICES  Per UR Regulation:    If discussed at Long Length of Stay Meetings, dates discussed:    Comments:  08/31/13 17:00 CM spoke with pt's daughter in room, Talbert Forest to offer choice.  Pt and daughter chose [facility] for HHPT/RN/SW/aide.  Address and contact number verified. Referral faxed to [facility].  No DME requested as RN states family has a rolling walker at home.  No other CM needs were commnicated.  Freddy Jaksch, BSN, CM [phone].

## 2013-08-31 NOTE — Progress Notes (Addendum)
Subjective:  Pt seen and examined in AM. No acute events overnight. Daughter present at bedside and reports she is much better today, closer to her baseline mental status. She is alert and orientated x 2 (self and place, not time).  Patient says she feels good and reports itching better after using cream. Also with improved urinary frequency. She complains of left ankle pain (chronically) and left finger pain after she fell yesterday.     Objective: Vital signs in last 24 hours: Filed Vitals:   08/31/13 0829 08/31/13 0830 08/31/13 0831 08/31/13 1425  BP: 145/51 147/53 175/64 120/60  Pulse: 61 63 67 72  Temp: 98.5 F (36.9 C)   98.7 F (37.1 C)  TempSrc: Oral   Oral  Resp: 18   18  Height:      Weight:      SpO2: 97%   98%   Weight change:   Intake/Output Summary (Last 24 hours) at 08/31/13 1510 Last data filed at 08/31/13 0900  Gross per 24 hour  Intake    360 ml  Output      0 ml  Net    360 ml    Constitutional: She is oriented to person, place, and time and well-developed, well-nourished, and in no distress.  HENT: EOMI Head: Normocephalic and atraumatic.  Mouth/Throat: Oropharynx is clear and moist.  Eyes: Conjunctivae and EOM are normal. Pupils are equal, round, and reactive to light.  Neck: Normal range of motion. Neck supple.  Cardiovascular: Normal rate, regular rhythm, normal heart sounds and intact distal pulses. Exam reveals no gallop and no friction rub. No murmur heard. LUA AVF with strong bruit and palpable thrill.  Pulmonary/Chest: Effort normal and breath sounds normal.  Abdominal: Soft. Bowel sounds are normal. She exhibits no distension. There is no tenderness.  Musculoskeletal: Normal range of motion. She exhibits no edema or tenderness.  Neurological: She is alert and oriented to person, place, and time. No cranial nerve deficit. GCS score is 15.  Skin: Skin is warm and dry. Rash (Diffuse maculopapular rash on abdomen, back, and bilateral arms. It is  most itchy in the skin fold of her groin. Legs and face are largely spared.) noted. She is not diaphoretic.  Superficial scrape on her right elbow, nonbleeding.  Psychiatric: Affect normal.  Patient is oriented to person, place  Lab Results: Basic Metabolic Panel:  Recent Labs Lab 08/30/13 1618 08/31/13 0530  NA 138 140  K 5.6* 5.2*  CL 107 110  CO2 18* 22  GLUCOSE 80 96  BUN 35* 35*  CREATININE 3.23* 3.25*  CALCIUM 8.9 8.4   Liver Function Tests:  Recent Labs Lab 08/30/13 1618  AST 21  ALT 8  ALKPHOS 52  BILITOT 0.7  PROT 7.3  ALBUMIN 3.5   No results found for this basename: LIPASE, AMYLASE,  in the last 168 hours No results found for this basename: AMMONIA,  in the last 168 hours CBC:  Recent Labs Lab 08/30/13 1618  WBC 5.2  NEUTROABS 2.4  HGB 10.6*  HCT 32.5*  MCV 107.3*  PLT 199   Cardiac Enzymes:  Recent Labs Lab 08/30/13 1618  TROPONINI <0.30   BNP: No results found for this basename: PROBNP,  in the last 168 hours D-Dimer: No results found for this basename: DDIMER,  in the last 168 hours CBG: No results found for this basename: GLUCAP,  in the last 168 hours Hemoglobin A1C: No results found for this basename: HGBA1C,  in the  last 168 hours Fasting Lipid Panel: No results found for this basename: CHOL, HDL, LDLCALC, TRIG, CHOLHDL, LDLDIRECT,  in the last 168 hours Thyroid Function Tests: No results found for this basename: TSH, T4TOTAL, FREET4, T3FREE, THYROIDAB,  in the last 168 hours Coagulation: No results found for this basename: LABPROT, INR,  in the last 168 hours Anemia Panel: No results found for this basename: VITAMINB12, FOLATE, FERRITIN, TIBC, IRON, RETICCTPCT,  in the last 168 hours Urine Drug Screen: Drugs of Abuse  No results found for this basename: labopia, cocainscrnur, labbenz, amphetmu, thcu, labbarb    Alcohol Level:  Recent Labs Lab 08/30/13 1618  ETH <11   Urinalysis:  Recent Labs Lab 08/30/13 1614    COLORURINE YELLOW  LABSPEC 1.009  PHURINE 5.5  GLUCOSEU NEGATIVE  HGBUR SMALL*  BILIRUBINUR NEGATIVE  KETONESUR NEGATIVE  PROTEINUR NEGATIVE  UROBILINOGEN 0.2  NITRITE NEGATIVE  LEUKOCYTESUR LARGE*   Misc. Labs:  Micro Results: No results found for this or any previous visit (from the past 240 hour(s)). Studies/Results: Ct Head Wo Contrast  08/30/2013   CLINICAL DATA:  Fall. Hit head. Unresponsive. Headache. Vertigo and hypertension.  EXAM: CT HEAD WITHOUT CONTRAST  TECHNIQUE: Contiguous axial images were obtained from the base of the skull through the vertex without intravenous contrast.  COMPARISON:  None.  FINDINGS: There is no evidence of intracranial hemorrhage, brain edema, or other signs of acute infarction. There is no evidence of intracranial mass lesion or mass effect. No abnormal extraaxial fluid collections are identified.  Mild diffuse cerebral atrophy and chronic small vessel disease noted. No evidence of hydrocephalus. No fracture or other bone lesions involving the skull.  IMPRESSION: No acute intracranial findings.  Mild cerebral atrophy and chronic small vessel disease.   Electronically Signed   By: Myles Rosenthal M.D.   On: 08/30/2013 17:00   Dg Chest Port 1 View  08/30/2013   CLINICAL DATA:  Altered mental status. Hypertension.  EXAM: PORTABLE CHEST - 1 VIEW  COMPARISON:  04/02/2006  FINDINGS: Low lung volumes over demonstrated. Heart size is prominent, likely due low lung volumes. Both lungs remain clear.  IMPRESSION: Low lung volumes. No acute findings.   Electronically Signed   By: Myles Rosenthal M.D.   On: 08/30/2013 17:07   Medications: I have reviewed the patient's current medications. Scheduled Meds: . amLODipine  10 mg Oral Daily  . enoxaparin (LOVENOX) injection  30 mg Subcutaneous Q24H  . metoprolol  100 mg Oral BID  . polyethylene glycol  17 g Oral Daily  . simvastatin  20 mg Oral q1800  . sodium polystyrene  15 g Oral TID   Continuous Infusions:  PRN  Meds:. Assessment/Plan: Principal Problem:   Acute encephalopathy Active Problems:   Scabies   Urinary tract infection   Chronic kidney disease (CKD), stage IV (severe)   Essential hypertension, benign   Hyperkalemia   Hyperlipidemia  Assessment & Plan by Problem:  WILSON SAMPLE is a 79 y.o. female PMH HTN, CKD, HLD who presented on 11/1 with altered mental status.   #AMS - resolved. Per family much better today and near baseline most likely due to UTI vs insomnia due to pruritis from presumed scabies infection in setting of dementia. - PT eval and treat --> suggested placement however pt would like to go home, already has rolling walker at home -Case manager consult -->Home health RN, PT, Aide, SW consult to assess home safety -Treat UTI -Treat presumed scabies infection   #Uncomplicated UTI -  Patient symptomatic with history of urinary frequency. UA is notable for large leuk esterase, rare bacteria. Urine culture is pending. UTI is a common cause of altered mental status in the elderly. No signs of dehydration on exam.  -Urine culture--->  >100K multiple bacterial morphotypes, none predominant - Continue IV ceftriaxone transition to keflex 500mg  BID x 7 days - Encourage po fluid intake   #Presumed scabies - Patient has a typical, pruritic scabies rash and a strong exposure history. Family is not sure if all linens and clothing in the house were washed after her daughter was recently treated for scabies. Patient reports insomnia 2/2 itching, which could be contributing to her history of AMS and falls. Ideally examination of skin scraping under ight microscope needed to make diagnosis.  - Permethrin cream applied head to toe, leave on for 12 hours before washing  - Linen change  - Contact precautions  -Since crusted lesions will need oral ivermectin (15mg  x1 then 10 days later) and 5% permethrin cream daily x1 week then x2 per week until cure   #Hyperkalemia - Improved. Currently  5.2,  on admission 5.6. No concerning EKG changes. Patient has CKD stage 5. We do not know her baseline potassium level.  - Kayexalate 15g TID  - Repeat BMP in am  - Repeat EKG w/o prior artifact   #CKD, stage 5 - BUN/Cr 35/3.2, stable since 2007 (BUN/Cr 38/3.2 on 03/05/06). Patient has an AVF but does not desire dialysis and says she has never had it in the past.  - Renal diet   #HTN - currently normotensive  - Continue home amlodipine, metoprolol   #HLD - Continue home statin   #DVT PPX - Lovenox subq with renal dosing   #Code status - Limited. Yes to chest compression and defibrillation, no to intubation.    Dispo: Disposition is deferred at this time, awaiting improvement of current medical problems.  Anticipated discharge today .Marland Kitchen   The patient does have a current PCP Dr. Windle Guard  and does need an [facility] hospital follow-up appointment after discharge.  The patient does have transportation limitations that hinder transportation to clinic appointments.  .Services Needed at time of discharge: Y = Yes, Blank = No PT:   OT:   RN:   Equipment:   Other:     LOS: 1 day   Otis Brace, MD 08/31/2013, 3:10 PM

## 2013-08-31 NOTE — H&P (Signed)
  Date: 08/31/2013  Patient name: Lisa Mcintyre  Medical record number: [id number]  Date of birth: February 12, 1927   I have seen and evaluated Lisa Mcintyre and discussed their care with the Residency Team.   Assessment and Plan: I have seen and evaluated the patient as outlined above. I agree with the formulated Assessment and Plan as detailed in the residents' admission note, with the following changes:   1. Acute encephalopathy - resolved. Pt is at baseline. Appears to have baseline dementia but per family is independent in her ADL's and able to take walks QD. Etiology is likely multifactorial.  2. UTI - Cont Rocephin and transition to PO ABX once cx is back.   3. Presumed scabies - She has been treated with permethrin cream. She is still itching.   4. Falls prior to admission - This appears to have been only on day of admission as she is able to take QD walks otherwise. She states that she is still globally weak but her neuro exam is non focal. We will obtain PT/OT eval for D/C recs.   5. ESRD - her Creatinine has been stable since 2007. Out pt mgmt.   D/C home once can change ABX to PO and after we get PT/OT recs.   Burns Spain, MD 11/2/20149:50 AM

## 2013-08-31 NOTE — Evaluation (Signed)
Physical Therapy Evaluation Patient Details Name: Lisa Mcintyre MRN: [id number] DOB: Aug 27, 1927 Today's Date: 08/31/2013 Time: 4098-1191 PT Time Calculation (min): 22 min  PT Assessment / Plan / Recommendation History of Present Illness  Pt is 79 y/o femal admitted for AMS.  In the ED the following statement was provided "The patient states that at 2am this morning she woke up and went to the bathroom. She lost her footing and fell between the commode and the sink, with no trauma, LOC, or head injury. Again, around 1:30pm, family states she fell again. She was sitting on the couch when it occurred the second time. Family thought she was confused and short of breath, so they called EMS. Per daughter, she is now at her baseline mental status. Patient denies new weakness, numbness, chest pain, shortness of breath, abdominal pain, pain in her extremities or joints. She notes that she sometimes gets short of breath when she walks around the neighborhood, which is a daily routine for her."  Clinical Impression  Pt admitted with the above. Pt currently with functional limitations due to the deficits listed below (see PT Problem List). Pt son reports that pt's dtr lives with them as well.  However pt's son reported he was not home during the day and pt's dtr is there most of the time.  Pt's son also reported that pt gets up every night at 3 am and walks down the street to check on dogs. (not sure about this story however pt confirms walking at 3-4am)  Questionable if safe for pt and being high falls risk and time of day.  Pt's son also reported that his neighbors are the one that told him he should have called 911 instead of going to get the fire department.  Pt's son did not know to call 911 when pt was unable to get OOB.  Therefore highly recommend SW consult.  Recommend SNF prior to d/c home.  However if pt refuses pt will need 24 hour assistance and recommend.  Pt will benefit from skilled PT to increase their  independence and safety with mobility to allow discharge to the venue listed below.      PT Assessment  Patient needs continued PT services    Follow Up Recommendations  SNF;Supervision/Assistance - 24 hour    Equipment Recommendations  Rolling walker with 5" wheels    Recommendations for Other Services Other (comment) (SW consult)   Frequency Min 3X/week    Precautions / Restrictions Precautions Precautions: Fall (multiple falls) Precaution Comments: Pt has scabies. Restrictions Weight Bearing Restrictions: No   Pertinent Vitals/Pain No c/o pain; mostly c/o itching      Mobility  Bed Mobility Bed Mobility: Supine to Sit;Sitting - Scoot to Edge of Bed Supine to Sit: 4: Min assist;With rails;HOB elevated Details for Bed Mobility Assistance: (A) to elevate trunk OOB with cues for technique Transfers Transfers: Sit to Stand;Stand to Sit Sit to Stand: 3: Mod assist;4: Min assist;From bed;From chair/3-in-1 Stand to Sit: 4: Min assist;To chair/3-in-1 Details for Transfer Assistance: (A) to initiate transfer and slowly descend to recliner with cues for hand placement Ambulation/Gait Ambulation/Gait Assistance: 4: Min assist;3: Mod assist Ambulation Distance (Feet): 5 Feet Assistive device: 1 person hand held assist Ambulation/Gait Assistance Details: (A) to maintain balance with cues for upright posture.  Pt with bilateral knee buckling and needed (A) to maintain balance.  Gait Pattern: Step-through pattern;Decreased stride length;Shuffle;Trunk flexed Gait velocity: decrease Stairs: No    Exercises  PT Diagnosis: Difficulty walking;Generalized weakness;Acute pain  PT Problem List: Decreased strength;Decreased activity tolerance;Decreased balance;Decreased mobility;Decreased knowledge of use of DME;Decreased cognition;Decreased knowledge of precautions;Pain PT Treatment Interventions: DME instruction;Gait training;Functional mobility training;Therapeutic  activities;Therapeutic exercise;Balance training;Patient/family education     PT Goals(Current goals can be found in the care plan section) Acute Rehab PT Goals Patient Stated Goal: Did not set PT Goal Formulation: With patient/family Time For Goal Achievement: 09/07/13 Potential to Achieve Goals: Good  Visit Information  Last PT Received On: 08/31/13 Assistance Needed: +2 (ambulation) History of Present Illness: Pt is 79 y/o femal admitted for AMS.  In the ED the following statement was provided "The patient states that at 2am this morning she woke up and went to the bathroom. She lost her footing and fell between the commode and the sink, with no trauma, LOC, or head injury. Again, around 1:30pm, family states she fell again. She was sitting on the couch when it occurred the second time. Family thought she was confused and short of breath, so they called EMS. Per daughter, she is now at her baseline mental status. Patient denies new weakness, numbness, chest pain, shortness of breath, abdominal pain, pain in her extremities or joints. She notes that she sometimes gets short of breath when she walks around the neighborhood, which is a daily routine for her."       Prior Functioning  Home Living Family/patient expects to be discharged to:: Private residence Living Arrangements: Children Available Help at Discharge: Family Type of Home: House Home Layout: One level Home Equipment: None Additional Comments: Pt son reports that pt's dtr lives with them as well.  However pt's son reported he was not home during the day and pt's dtr is there most of the time.  Pt's son also reported that pt gets up every night at 3 am and walks down the street to check on dogs.  Questionable if safe for pt and being high falls risk.  Pt's son also reported that his neighbors are the one that told him he should have called 911 instead of going to get the fire department.  P't's son did not know to call 911 when pt  was unable to get OOB.  Therefore highly recommend SW consult.  Prior Function Level of Independence: Independent Communication Communication: No difficulties Dominant Hand: Right    Cognition  Cognition Arousal/Alertness: Awake/alert Behavior During Therapy: Restless (mostly likely due to itching from scabies) Overall Cognitive Status: Impaired/Different from baseline Area of Impairment: Memory;Safety/judgement;Awareness;Problem solving;Orientation Orientation Level: Disoriented to;Time;Place Safety/Judgement: Decreased awareness of safety Awareness: Intellectual Problem Solving: Slow processing;Difficulty sequencing;Requires verbal cues General Comments: Pt unable to recall month or year but knew it was a sunday.  Pt slow to process and asked same questions over again.     Extremity/Trunk Assessment Lower Extremity Assessment Lower Extremity Assessment: Generalized weakness   Balance Balance Balance Assessed: Yes Static Sitting Balance Static Sitting - Balance Support: Feet supported Static Sitting - Level of Assistance: 5: Stand by assistance Static Standing Balance Static Standing - Balance Support: Right upper extremity supported Static Standing - Level of Assistance: 4: Min assist  End of Session PT - End of Session Equipment Utilized During Treatment: Gait belt Activity Tolerance: Patient limited by fatigue Patient left: in chair;with call bell/phone within reach Nurse Communication: Mobility status  GP     Nichael Ehly 08/31/2013, 1:19 PM  Jake Shark, PT DPT [phone]

## 2013-08-31 NOTE — Progress Notes (Signed)
Spoke at length with patient, son(Harmon), daughter(Doris), all of whom live in patient's home. Orson Slick is retired, Tyler Aas is disabled due to right eye blindness and "nervous condition." Tyler Aas performs the duties of cooking, Chartered certified accountant. Orson Slick takes care of the yard work,grocery shopping and taking patient to Dr. There is another daughter that lives outside the home that works [employer]. Thru Fri. From 7 to 3:30pm who lives 1 mile away and can assist if needed. Orson Slick has friends at [employer],. a couple of miles down the road, if he "needs help." Spoke with Futures trader and Dr.Rabbani. HH RN,PT, AIDE and SW set up.Patient was adamant not to go to nursing home.

## 2013-10-22 ENCOUNTER — Emergency Department (HOSPITAL_COMMUNITY)
Admission: EM | Admit: 2013-10-22 | Discharge: 2013-10-22 | Disposition: A | Discharge status: Home / Self Care | Payer: Medicare Other | Attending: Emergency Medicine | Admitting: Emergency Medicine

## 2013-10-22 ENCOUNTER — Emergency Department (HOSPITAL_COMMUNITY): Payer: Medicare Other

## 2013-10-22 ENCOUNTER — Encounter (HOSPITAL_COMMUNITY): Payer: Self-pay | Admitting: Emergency Medicine

## 2013-10-22 DIAGNOSIS — Z79899 Other long term (current) drug therapy: Secondary | ICD-10-CM | POA: Insufficient documentation

## 2013-10-22 DIAGNOSIS — W1809XA Striking against other object with subsequent fall, initial encounter: Secondary | ICD-10-CM | POA: Insufficient documentation

## 2013-10-22 DIAGNOSIS — Z87891 Personal history of nicotine dependence: Secondary | ICD-10-CM | POA: Insufficient documentation

## 2013-10-22 DIAGNOSIS — T148XXA Other injury of unspecified body region, initial encounter: Secondary | ICD-10-CM

## 2013-10-22 DIAGNOSIS — S0100XA Unspecified open wound of scalp, initial encounter: Secondary | ICD-10-CM | POA: Insufficient documentation

## 2013-10-22 DIAGNOSIS — S0003XA Contusion of scalp, initial encounter: Secondary | ICD-10-CM | POA: Insufficient documentation

## 2013-10-22 DIAGNOSIS — Y9389 Activity, other specified: Secondary | ICD-10-CM | POA: Insufficient documentation

## 2013-10-22 DIAGNOSIS — Z87448 Personal history of other diseases of urinary system: Secondary | ICD-10-CM | POA: Insufficient documentation

## 2013-10-22 DIAGNOSIS — W19XXXA Unspecified fall, initial encounter: Secondary | ICD-10-CM

## 2013-10-22 DIAGNOSIS — IMO0002 Reserved for concepts with insufficient information to code with codable children: Secondary | ICD-10-CM

## 2013-10-22 DIAGNOSIS — Y929 Unspecified place or not applicable: Secondary | ICD-10-CM | POA: Insufficient documentation

## 2013-10-22 DIAGNOSIS — I10 Essential (primary) hypertension: Secondary | ICD-10-CM | POA: Insufficient documentation

## 2013-10-22 LAB — URINALYSIS, ROUTINE W REFLEX MICROSCOPIC
Bilirubin Urine: NEGATIVE
Hgb urine dipstick: NEGATIVE
Ketones, ur: NEGATIVE mg/dL
Specific Gravity, Urine: 1.014 (ref 1.005–1.030)
Urobilinogen, UA: 0.2 mg/dL (ref 0.0–1.0)
pH: 5.5 (ref 5.0–8.0)

## 2013-10-22 LAB — URINE MICROSCOPIC-ADD ON

## 2013-10-22 NOTE — ED Notes (Signed)
Patient transported to CT 

## 2013-10-22 NOTE — ED Provider Notes (Addendum)
CSN: [id number]     Arrival date & time 10/22/13  1624 History   First MD Initiated Contact with Patient 10/22/13 1641     Chief Complaint  Patient presents with  . Head Injury   (Consider location/radiation/quality/duration/timing/severity/associated sxs/prior Treatment) HPI Comments: Pt had a fall this morning, around 11:30. She is 79 y/o, and had a mechanical fall, as she got a little dizzy. She had some bleeding from her scalp, and therefore went to an urgent care- and the urgent care referred her to ED. Pt's bleeding has ceased by the time i saw her. She in not on anticoagulants or antiplatelets agents. Pt denies nausea, vomiting, visual complains, seizures, altered mental status, loss of consciousness, new weakness, or numbness. She has been having episodic falls - and uses a cane to walk. Currently no dizziness.    Patient is a 79 y.o. female presenting with head injury. The history is provided by the patient.  Head Injury Associated symptoms: headache   Associated symptoms: no nausea, no neck pain and no vomiting     Past Medical History  Diagnosis Date  . Hypertension   . Renal disorder    Past Surgical History  Procedure Laterality Date  . Other surgical history      LT Ankle FX Repair, RUA AVG   No family history on file. History  Substance Use Topics  . Smoking status: Former Smoker    Types: Cigarettes  . Smokeless tobacco: Not on file  . Alcohol Use: No   OB History   Grav Para Term Preterm Abortions TAB SAB Ect Mult Living                 Review of Systems  Constitutional: Negative for activity change and fatigue.  Eyes: Negative for visual disturbance.  Respiratory: Negative for shortness of breath.   Cardiovascular: Negative for chest pain.  Gastrointestinal: Negative for nausea, vomiting and abdominal pain.  Genitourinary: Negative for dysuria.  Musculoskeletal: Negative for arthralgias and neck pain.  Neurological: Positive for headaches.   Hematological: Does not bruise/bleed easily.    Allergies  Review of patient's allergies indicates no known allergies.  Home Medications   Current Outpatient Rx  Name  Route  Sig  Dispense  Refill  . amLODipine (NORVASC) 10 MG tablet   Oral   Take 10 mg by mouth daily.         Marland Kitchen CALCIUM PO   Oral   Take 2 tablets by mouth daily.          . metoprolol (LOPRESSOR) 100 MG tablet   Oral   Take 100 mg by mouth 2 (two) times daily.         . pravastatin (PRAVACHOL) 40 MG tablet   Oral   Take 40 mg by mouth daily.          BP 154/107  Pulse 59  Temp(Src) 98.8 F (37.1 C) (Oral)  Resp 18  SpO2 99% Physical Exam  Nursing note and vitals reviewed. Constitutional: She is oriented to person, place, and time. She appears well-developed and well-nourished.  HENT:  Head: Normocephalic and atraumatic.  Eyes: EOM are normal. Pupils are equal, round, and reactive to light.  Neck: Neck supple.  No midline c-spine tenderness, pt able to turn head to 45 degrees bilaterally without any pain and able to flex neck to the chest and extend without any pain or neurologic symptoms.   Cardiovascular: Normal rate, regular rhythm and normal heart sounds.  No murmur heard. Pulmonary/Chest: Effort normal. No respiratory distress.  Abdominal: Soft. She exhibits no distension. There is no tenderness. There is no rebound and no guarding.  Musculoskeletal:  Head to toe evaluation shows no facial abrasions, step offs, crepitus, no tenderness to palpation of the bilateral upper and lower extremities, no gross deformities, no chest tenderness, no pelvic pain.   Neurological: She is alert and oriented to person, place, and time.  Skin: Skin is warm and dry.  6 cm laceration to the right scalp    ED Course  Procedures (including critical care time) Labs Review Labs Reviewed  URINALYSIS, ROUTINE W REFLEX MICROSCOPIC - Abnormal; Notable for the following:    Leukocytes, UA MODERATE (*)     All other components within normal limits  URINE MICROSCOPIC-ADD ON - Abnormal; Notable for the following:    Squamous Epithelial / LPF MANY (*)    All other components within normal limits   Imaging Review Dg Ribs Unilateral W/chest Right  10/22/2013   CLINICAL DATA:  Fall with right anterior rib pain  EXAM: RIGHT RIBS AND CHEST - 3+ VIEW  COMPARISON:  Chest x-ray on 08/30/2013  FINDINGS: Stable mild cardiomegaly. No pneumothorax or pleural effusion is identified. Rib films show no evidence of acute rib fracture.  IMPRESSION: No right rib fracture identified.  Stable mild cardiomegaly.   Electronically Signed   By: Irish Lack M.D.   On: 10/22/2013 18:12   Ct Head Wo Contrast  10/22/2013   CLINICAL DATA:  Fall and head injury to posterior skull.  EXAM: CT HEAD WITHOUT CONTRAST  TECHNIQUE: Contiguous axial images were obtained from the base of the skull through the vertex without intravenous contrast.  COMPARISON:  08/30/2013  FINDINGS: Stable atrophy and small vessel disease. The brain demonstrates no evidence of hemorrhage, infarction, edema, mass effect, extra-axial fluid collection, hydrocephalus or mass lesion. The skull is unremarkable.  IMPRESSION: Stable atrophy and small vessel disease.  No acute findings.   Electronically Signed   By: Irish Lack M.D.   On: 10/22/2013 17:04    EKG Interpretation   None       MDM   1. Fall, initial encounter   2. Laceration   3. Contusion    DDx includes: - Mechanical falls - ICH - Fractures - Contusions - Soft tissue injury  Pt comes in post fall. Has a head laceration - that was sutured. Pt's CT head is normal. She is ambulating well - and ambulated around the hallway and to the bathroom without any problems.  Derwood Kaplan, MD 10/22/13 2116  LACERATION REPAIR Performed by: Derwood Kaplan Authorized by: Derwood Kaplan Consent: Verbal consent obtained. Risks and benefits: risks, benefits and alternatives were  discussed Consent given by: patient Patient identity confirmed: provided demographic data Prepped and Draped in normal sterile fashion Wound explored  Laceration Location: Right scalp  Laceration Length: 6 cm  No Foreign Bodies seen or palpated  Anesthesia: local infiltration  Local anesthetic: lidocaine 2% with epinephrine  Anesthetic total: 3 ml  Irrigation method: syringe Amount of cleaning: standard  Skin closure: Vicryl   Technique: Simple inturrupted  Patient tolerance: Patient tolerated the procedure well with no immediate complications.   Derwood Kaplan, MD 11/06/13 [phone]

## 2013-10-22 NOTE — ED Notes (Signed)
Pt st's she was attempting to use the restroom and missed the commode hitting her head on a metal shelf.  Pt has lac to right side of head.  No bleeding at this time. Pt is on blood thinners.  Pt denies LOC

## 2016-07-07 ENCOUNTER — Encounter (HOSPITAL_COMMUNITY): Payer: Self-pay | Admitting: *Deleted

## 2016-07-07 ENCOUNTER — Emergency Department (HOSPITAL_COMMUNITY)
Admission: EM | Admit: 2016-07-07 | Discharge: 2016-07-07 | Disposition: A | Discharge status: Home / Self Care | Payer: Medicare Other | Attending: Emergency Medicine | Admitting: Emergency Medicine

## 2016-07-07 ENCOUNTER — Emergency Department (HOSPITAL_COMMUNITY): Payer: Medicare Other

## 2016-07-07 DIAGNOSIS — Y9301 Activity, walking, marching and hiking: Secondary | ICD-10-CM | POA: Insufficient documentation

## 2016-07-07 DIAGNOSIS — Z85828 Personal history of other malignant neoplasm of skin: Secondary | ICD-10-CM | POA: Insufficient documentation

## 2016-07-07 DIAGNOSIS — Y929 Unspecified place or not applicable: Secondary | ICD-10-CM | POA: Diagnosis not present

## 2016-07-07 DIAGNOSIS — N186 End stage renal disease: Secondary | ICD-10-CM | POA: Diagnosis not present

## 2016-07-07 DIAGNOSIS — N390 Urinary tract infection, site not specified: Secondary | ICD-10-CM | POA: Insufficient documentation

## 2016-07-07 DIAGNOSIS — W19XXXA Unspecified fall, initial encounter: Secondary | ICD-10-CM

## 2016-07-07 DIAGNOSIS — Z87891 Personal history of nicotine dependence: Secondary | ICD-10-CM | POA: Insufficient documentation

## 2016-07-07 DIAGNOSIS — Y999 Unspecified external cause status: Secondary | ICD-10-CM | POA: Diagnosis not present

## 2016-07-07 DIAGNOSIS — S0990XA Unspecified injury of head, initial encounter: Secondary | ICD-10-CM | POA: Diagnosis present

## 2016-07-07 DIAGNOSIS — W1830XA Fall on same level, unspecified, initial encounter: Secondary | ICD-10-CM | POA: Diagnosis not present

## 2016-07-07 DIAGNOSIS — I120 Hypertensive chronic kidney disease with stage 5 chronic kidney disease or end stage renal disease: Secondary | ICD-10-CM | POA: Insufficient documentation

## 2016-07-07 HISTORY — DX: Chronic kidney disease, unspecified: N18.9

## 2016-07-07 LAB — BASIC METABOLIC PANEL
Anion gap: 8 (ref 5–15)
BUN: 41 mg/dL — AB (ref 6–20)
CHLORIDE: 116 mmol/L — AB (ref 101–111)
CO2: 17 mmol/L — ABNORMAL LOW (ref 22–32)
Calcium: 8.6 mg/dL — ABNORMAL LOW (ref 8.9–10.3)
Creatinine, Ser: 3.57 mg/dL — ABNORMAL HIGH (ref 0.44–1.00)
GFR calc Af Amer: 12 mL/min — ABNORMAL LOW (ref >60)
GFR calc non Af Amer: 11 mL/min — ABNORMAL LOW (ref >60)
GLUCOSE: 78 mg/dL (ref 65–99)
Potassium: 5.3 mmol/L — ABNORMAL HIGH (ref 3.5–5.1)
Sodium: 141 mmol/L (ref 135–145)

## 2016-07-07 LAB — URINALYSIS, ROUTINE W REFLEX MICROSCOPIC
Bilirubin Urine: NEGATIVE
GLUCOSE, UA: NEGATIVE mg/dL
Ketones, ur: NEGATIVE mg/dL
NITRITE: NEGATIVE
PH: 7 (ref 5.0–8.0)
Protein, ur: 100 mg/dL — AB
Specific Gravity, Urine: 1.014 (ref 1.005–1.030)

## 2016-07-07 LAB — CBC
HCT: 30.3 % — ABNORMAL LOW (ref 36.0–46.0)
HEMOGLOBIN: 9.7 g/dL — AB (ref 12.0–15.0)
MCH: 32.6 pg (ref 26.0–34.0)
MCHC: 32 g/dL (ref 30.0–36.0)
MCV: 101.7 fL — AB (ref 78.0–100.0)
Platelets: 169 10*3/uL (ref 150–400)
RBC: 2.98 MIL/uL — AB (ref 3.87–5.11)
RDW: 13.8 % (ref 11.5–15.5)
WBC: 4.7 10*3/uL (ref 4.0–10.5)

## 2016-07-07 LAB — URINE MICROSCOPIC-ADD ON

## 2016-07-07 LAB — I-STAT TROPONIN, ED: Troponin i, poc: 0.06 ng/mL (ref 0.00–0.08)

## 2016-07-07 MED ORDER — CEPHALEXIN 500 MG PO CAPS: 500.0000 mg | ORAL_CAPSULE | Freq: Four times a day (QID) | ORAL | 0 refills | Status: AC

## 2016-07-07 MED ORDER — SODIUM CHLORIDE 0.9 % IV BOLUS (SEPSIS)
500.0000 mL | Freq: Once | INTRAVENOUS | Status: DC
Start: 2016-07-07 — End: 2016-07-07

## 2016-07-07 NOTE — ED Provider Notes (Signed)
[facility]-EMERGENCY DEPT Provider Note   CSN: [id number] Arrival date & time: 07/07/16  1551     History   Chief Complaint Chief Complaint  Patient presents with  . Fall    HPI Lisa Mcintyre is a 82 y.o. female.  HPI  82 y.o. female with a hx of HTN, presents to the Emergency Department today via EMS due suspect mechanical fall around 0100. Pt states that she has fallen a lot recently and is due to mechanical mechanisms. States that she fell forward while walking to the bathroom. Does not use walker or cane, because she does not like them. Unsure of LOC. Pt has hx falls in past. No N/V/D. No headache. No CP/SOB/ABD pain. No visual changes. Notes no pain currently. Does not use blood thinners. Pt is hospice patient due to ESRD and refusing Dialysis. Unsure if still producing urine. No other symptoms noted.   Past Medical History:  Diagnosis Date  . Hypertension   . Renal disorder     Patient Active Problem List   Diagnosis Date Noted  . Acute encephalopathy 08/30/2013  . Scabies 08/30/2013  . Urinary tract infection 08/30/2013  . Chronic kidney disease (CKD), stage IV (severe) (HCC) 08/30/2013  . Essential hypertension, benign 08/30/2013  . Hyperkalemia 08/30/2013  . Hyperlipidemia 08/30/2013    Past Surgical History:  Procedure Laterality Date  . OTHER SURGICAL HISTORY     LT Ankle FX Repair, RUA AVG    OB History    No data available     Home Medications    Prior to Admission medications   Medication Sig Start Date End Date Taking? Authorizing Provider  amLODipine (NORVASC) 10 MG tablet Take 10 mg by mouth daily.    Historical Provider, MD  CALCIUM PO Take 2 tablets by mouth daily.     Historical Provider, MD  metoprolol (LOPRESSOR) 100 MG tablet Take 100 mg by mouth 2 (two) times daily.    Historical Provider, MD  pravastatin (PRAVACHOL) 40 MG tablet Take 40 mg by mouth daily.    Historical Provider, MD    Family History History reviewed. No pertinent family  history.  Social History Social History  Substance Use Topics  . Smoking status: Former Smoker    Types: Cigarettes  . Smokeless tobacco: Never Used  . Alcohol use No     Allergies   Review of patient's allergies indicates no known allergies.   Review of Systems Review of Systems ROS reviewed and all are negative for acute change except as noted in the HPI.  Physical Exam Updated Vital Signs BP 132/89 (BP Location: Right Arm)   Pulse (!) 57   Temp 98.3 F (36.8 C) (Oral)   Resp 18   SpO2 98%   Physical Exam  Constitutional: She is oriented to person, place, and time. Vital signs are normal. She appears well-developed and well-nourished.  HENT:  Head: Normocephalic and atraumatic.  Right Ear: Hearing normal.  Left Ear: Hearing normal.  Eyes: Conjunctivae and EOM are normal. Pupils are equal, round, and reactive to light.  Neck: Normal range of motion. Neck supple.  Cardiovascular: Normal rate, regular rhythm, normal heart sounds and intact distal pulses.   Pulmonary/Chest: Effort normal and breath sounds normal. No respiratory distress. She has no wheezes. She has no rales. She exhibits no tenderness.  Abdominal: Soft.  Neurological: She is alert and oriented to person, place, and time. She has normal strength. No cranial nerve deficit or sensory deficit.  Cranial Nerves:  II: Pupils equal, round, reactive to light III,IV, VI: ptosis not present, extra-ocular motions intact bilaterally  V,VII: smile symmetric, facial light touch sensation equal VIII: hearing grossly normal bilaterally  IX,X: midline uvula rise  XI: bilateral shoulder shrug equal and strong XII: midline tongue extension  Skin: Skin is warm and dry.  1.5cm diameter skin lesions noted on left scalp. Appears to be previous injury.   Psychiatric: She has a normal mood and affect. Her speech is normal and behavior is normal. Thought content normal.  Nursing note and vitals reviewed.  ED Treatments /  Results  Labs (all labs ordered are listed, but only abnormal results are displayed) Labs Reviewed  URINALYSIS, ROUTINE W REFLEX MICROSCOPIC (NOT AT [facility]) - Abnormal; Notable for the following:       Result Value   APPearance CLOUDY (*)    Hgb urine dipstick TRACE (*)    Protein, ur 100 (*)    Leukocytes, UA MODERATE (*)    All other components within normal limits  CBC - Abnormal; Notable for the following:    RBC 2.98 (*)    Hemoglobin 9.7 (*)    HCT 30.3 (*)    MCV 101.7 (*)    All other components within normal limits  BASIC METABOLIC PANEL - Abnormal; Notable for the following:    Potassium 5.3 (*)    Chloride 116 (*)    CO2 17 (*)    BUN 41 (*)    Creatinine, Ser 3.57 (*)    Calcium 8.6 (*)    GFR calc non Af Amer 11 (*)    GFR calc Af Amer 12 (*)    All other components within normal limits  URINE MICROSCOPIC-ADD ON - Abnormal; Notable for the following:    Squamous Epithelial / LPF 6-30 (*)    Bacteria, UA MANY (*)    All other components within normal limits  URINE CULTURE  I-STAT TROPOININ, ED   EKG  EKG Interpretation  Date/Time:  Friday July 07 2016 16:52:00 EDT Ventricular Rate:  58 PR Interval:    QRS Duration: 76 QT Interval:  473 QTC Calculation: 465 R Axis:   11 Text Interpretation:  Sinus rhythm When compared with ECG of 08/30/2013 No significant change was found Confirmed by [facility] Center[facility]CM[facility]  MD, Nicholos JohnsKATHLEEN [phone]([id number]) on 07/07/2016 5:11:40 PM      Radiology Dg Chest 2 View  Result Date: 07/07/2016 CLINICAL DATA:  82 y/o  F; status post fall with confusion. EXAM: CHEST  2 VIEW COMPARISON:  10/22/2013 chest radiograph. FINDINGS: Stable cardiomegaly given differences in technique. Calcific aortic atherosclerosis of the arch. No acute osseous abnormality. No focal consolidation, pneumothorax, or pleural effusion. IMPRESSION: 1. No active cardiopulmonary disease. 2. Aortic atherosclerosis. 3. Stable cardiomegaly. Electronically Signed   By: Mitzi HansenLance  Furusawa-Stratton  M.D.   On: 07/07/2016 18:18   Procedures Procedures (including critical care time)  Medications Ordered in ED Medications - No data to display Initial Impression / Assessment and Plan / ED Course  I have reviewed the triage vital signs and the nursing notes.  Pertinent labs & imaging results that were available during my care of the patient were reviewed by me and considered in my medical decision making (see chart for details).  Clinical Course    Final Clinical Impressions(s) / ED Diagnoses  I have reviewed and evaluated the relevant laboratory values I have reviewed and evaluated the relevant imaging studies.  I have interpreted the relevant EKG. I have reviewed the relevant previous  healthcare records. I have reviewed EMS Documentation. I obtained HPI from historian. Patient discussed with supervising physician  ED Course:  Assessment: Pt is a 88yF with hx HTN who presents with fall around 0100 this AM. Family called hospice and told to come to ED. On exam, pt in NAD. Nontoxic/nonseptic appearing. VSS. Afebrile. Lungs CTA. Heart RRR. Abdomen nontender soft. CN evaluated and unremarkable. Skin lesion noted on left scalp. Son revealed that this is from possible carcinoma from months before. Pt being followed for this currently. CBC baseline. BMP shows slight increase from baseline from 2014, otherwise unremarkable. UA showed UTI. EKG unremarkable. iStat Trop negative. CT Head negative. CT C Spine negative. Plan is to DC Home. Hospice will have home visit tomorrow. At time of discharge, Patient is in no acute distress. Vital Signs are stable. Patient is able to ambulate. Patient able to tolerate PO.    5:26 PM- Spoke with Hospice Nurse Darl Pikes) on call #[phone]. Pt on hospice due to ESRD and does not wish to have Dialysis. Pt noted to have Basal cell Carcinoma and Acquired Hemolytic Anemia. Pt Hospice physician is Dr. Hal Hope. Hospice revoked if admitted. Pt DNR.   Disposition/Plan:    DC Home Additional Verbal discharge instructions given and discussed with patient.  Pt Instructed to f/u with PCP in the next week for evaluation and treatment of symptoms. Return precautions given Pt acknowledges and agrees with plan  Supervising Physician Samuel Jester, DO   Final diagnoses:  Fall, initial encounter  UTI (lower urinary tract infection)    New Prescriptions New Prescriptions   No medications on file     Audry Pili, PA-C 07/07/16 1943    Samuel Jester, DO 07/07/16 2343

## 2016-07-07 NOTE — ED Triage Notes (Signed)
Pt arrives from home via [facility]. Pt states she fell last night around 0100 while attempting to use the bedside commode. Pt has aloc at baseline per family and is currently a hospice pt. Pt has bruising noted above the right ear and a place over her left eye that her PCP suspects is cancer. Pt states her hips hurt when she walks.

## 2016-07-07 NOTE — Discharge Instructions (Signed)
Please read and follow all provided instructions.  Your diagnoses today include:  1. Fall, initial encounter    Tests performed today include: Vital signs. See below for your results today.   Medications prescribed:  Take as prescribed   Home care instructions:  Follow any educational materials contained in this packet.  Follow-up instructions: Please follow-up with your primary care provider for further evaluation of symptoms and treatment   Return instructions:  Please return to the Emergency Department if you do not get better, if you get worse, or new symptoms OR  - Fever (temperature greater than 101.13F)  - Bleeding that does not stop with holding pressure to the area    -Severe pain (please note that you may be more sore the day after your accident)  - Chest Pain  - Difficulty breathing  - Severe nausea or vomiting  - Inability to tolerate food and liquids  - Passing out  - Skin becoming red around your wounds  - Change in mental status (confusion or lethargy)  - New numbness or weakness    Please return if you have any other emergent concerns.  Additional Information:  Your vital signs today were: BP (!) 142/113    Pulse 66    Temp 98.3 F (36.8 C) (Oral)    Resp 14    SpO2 95%  If your blood pressure (BP) was elevated above 135/85 this visit, please have this repeated by your doctor within one month. ---------------

## 2016-07-09 LAB — URINE CULTURE

## 2016-12-24 IMAGING — CR DG CHEST 2V
2 series · 2 of 2 positions shown · non-contrast
Comparison: 10/22/2013 chest radiograph.

CLINICAL DATA: 88 y/o  F; status post fall with confusion.

EXAM:
CHEST  2 VIEW

[chest lat]
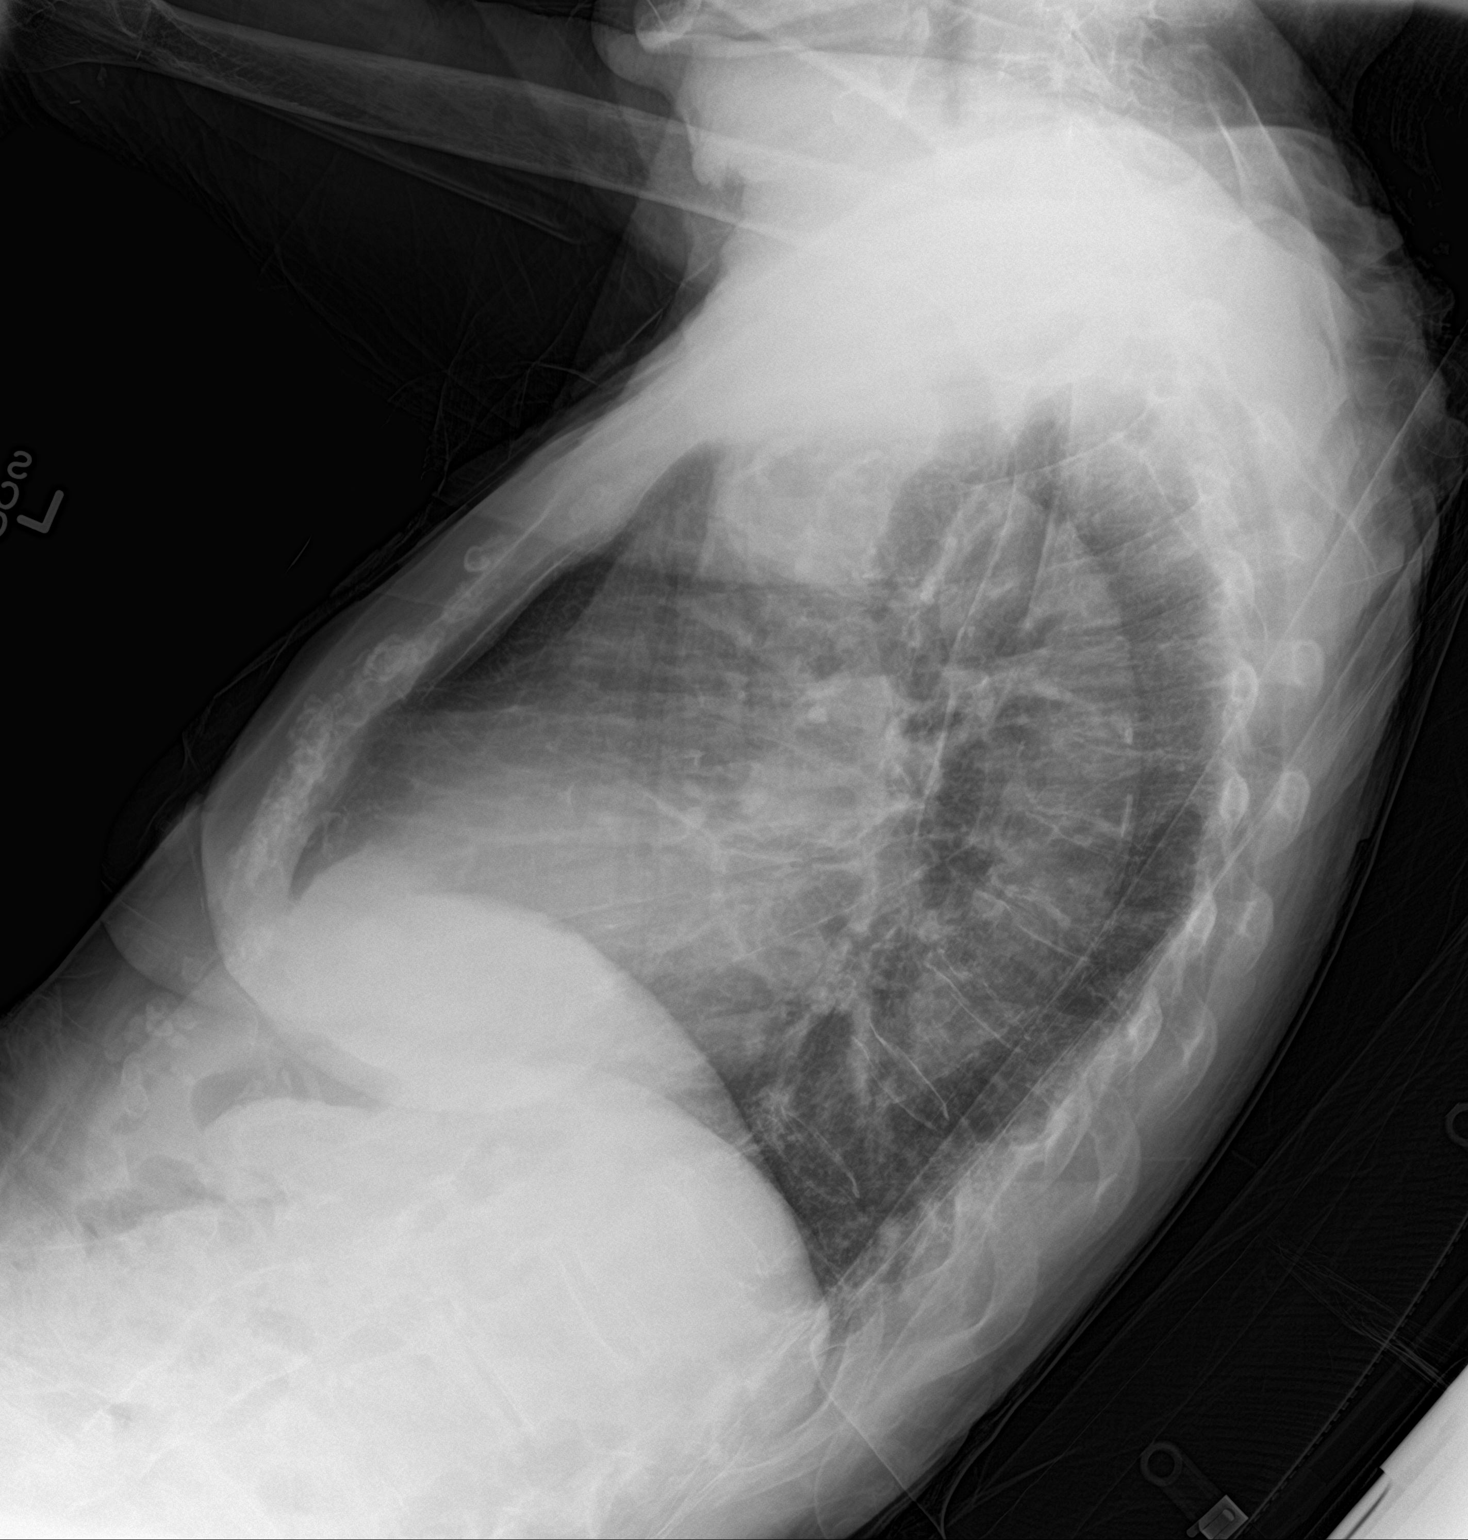

[chest ap]
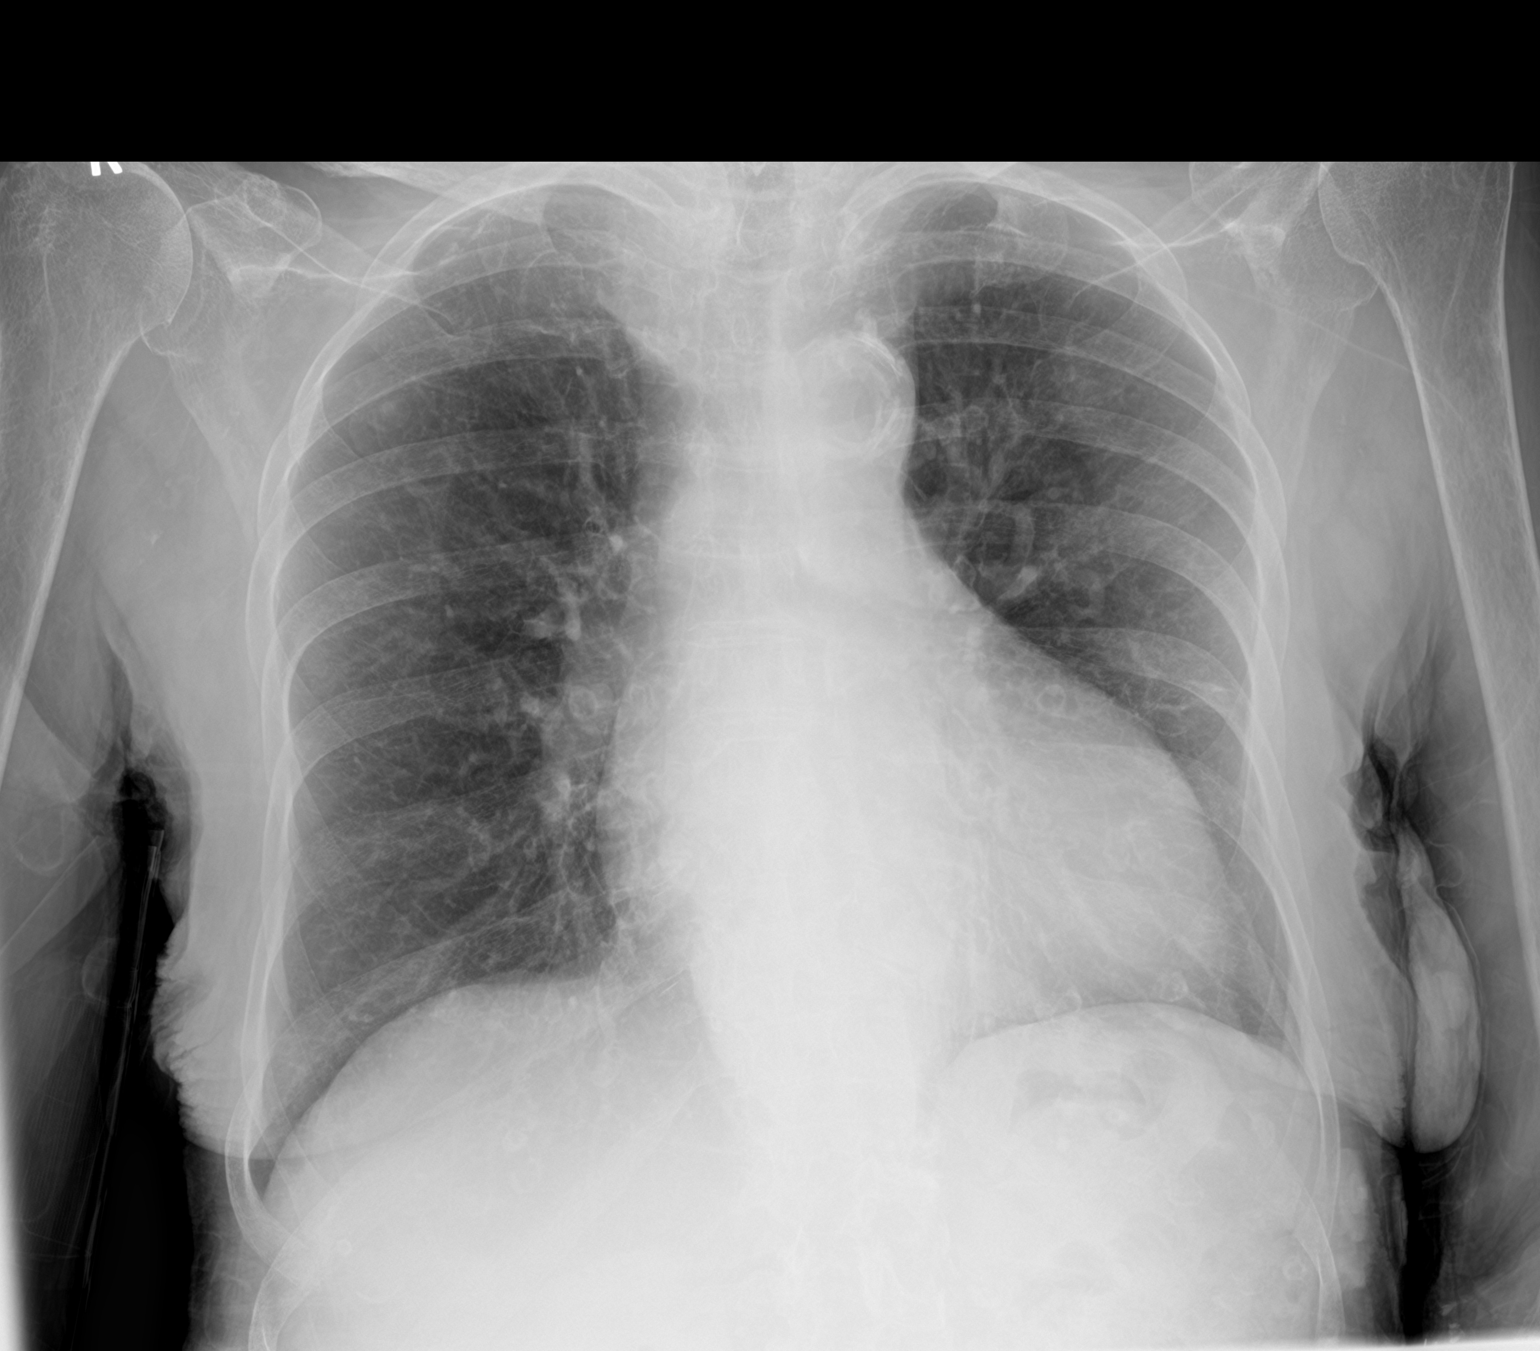

[2 of 2 positions shown; findings below may reference images not displayed]

FINDINGS: Stable cardiomegaly given differences in technique. Calcific aortic
atherosclerosis of the arch. No acute osseous abnormality. No focal
consolidation, pneumothorax, or pleural effusion.
IMPRESSION: 1. No active cardiopulmonary disease.
2. Aortic atherosclerosis.
3. Stable cardiomegaly.

By: Skykid Bw Prino Sugape M.D.

## 2018-09-29 DEATH — deceased
